# Patient Record
Sex: Female | Born: 1965 | Race: Black or African American | Hispanic: No | Marital: Single | Smoking: Current every day smoker
Health system: Southern US, Community
[De-identification: ages and names within clinical notes are randomized; demographics above are authoritative.]

## PROBLEM LIST (undated history)

## (undated) DIAGNOSIS — I10 Essential (primary) hypertension: Secondary | ICD-10-CM

## (undated) DIAGNOSIS — K219 Gastro-esophageal reflux disease without esophagitis: Secondary | ICD-10-CM

## (undated) DIAGNOSIS — E119 Type 2 diabetes mellitus without complications: Secondary | ICD-10-CM

## (undated) DIAGNOSIS — E079 Disorder of thyroid, unspecified: Secondary | ICD-10-CM

## (undated) DIAGNOSIS — I509 Heart failure, unspecified: Secondary | ICD-10-CM

## (undated) HISTORY — PX: CARDIAC CATHETERIZATION: SHX172

---

## 2016-03-17 ENCOUNTER — Encounter: Payer: Self-pay | Admitting: Emergency Medicine

## 2016-03-17 ENCOUNTER — Emergency Department
Admission: EM | Admit: 2016-03-17 | Discharge: 2016-03-17 | Disposition: A | Discharge status: Home / Self Care | Payer: Medicaid Other | Attending: Emergency Medicine | Admitting: Emergency Medicine

## 2016-03-17 DIAGNOSIS — Z79899 Other long term (current) drug therapy: Secondary | ICD-10-CM | POA: Diagnosis not present

## 2016-03-17 DIAGNOSIS — E119 Type 2 diabetes mellitus without complications: Secondary | ICD-10-CM | POA: Diagnosis not present

## 2016-03-17 DIAGNOSIS — F17210 Nicotine dependence, cigarettes, uncomplicated: Secondary | ICD-10-CM | POA: Diagnosis not present

## 2016-03-17 DIAGNOSIS — I509 Heart failure, unspecified: Secondary | ICD-10-CM | POA: Insufficient documentation

## 2016-03-17 DIAGNOSIS — M5412 Radiculopathy, cervical region: Secondary | ICD-10-CM

## 2016-03-17 DIAGNOSIS — I110 Hypertensive heart disease with heart failure: Secondary | ICD-10-CM | POA: Diagnosis not present

## 2016-03-17 DIAGNOSIS — M542 Cervicalgia: Secondary | ICD-10-CM | POA: Diagnosis present

## 2016-03-17 DIAGNOSIS — Z7982 Long term (current) use of aspirin: Secondary | ICD-10-CM | POA: Diagnosis not present

## 2016-03-17 DIAGNOSIS — Z7984 Long term (current) use of oral hypoglycemic drugs: Secondary | ICD-10-CM | POA: Insufficient documentation

## 2016-03-17 HISTORY — DX: Heart failure, unspecified: I50.9

## 2016-03-17 HISTORY — DX: Essential (primary) hypertension: I10

## 2016-03-17 HISTORY — DX: Gastro-esophageal reflux disease without esophagitis: K21.9

## 2016-03-17 HISTORY — DX: Type 2 diabetes mellitus without complications: E11.9

## 2016-03-17 HISTORY — DX: Disorder of thyroid, unspecified: E07.9

## 2016-03-17 LAB — GLUCOSE, CAPILLARY: GLUCOSE-CAPILLARY: 112 mg/dL — AB (ref 65–99)

## 2016-03-17 MED ORDER — MELOXICAM 15 MG PO TABS: 15.0000 mg | ORAL_TABLET | Freq: Every day | ORAL | 0 refills | Status: DC

## 2016-03-17 NOTE — ED Provider Notes (Signed)
[facility] Emergency Department Provider Note  ____________________________________________  Time seen: Approximately 2:42 PM  I have reviewed the triage vital signs and the nursing notes.   HISTORY  Chief Complaint Tingling    HPI Tracey Nolan is a 50 y.o. female , NAD, presents to the emergency department today history of pain and tingling about the right neck and right upper extremity. Patient states over the last 2 night she has been woken by a shooting pain along with tingling that starts about the right neck and can extend into the fingers on the right hand. States that the pain subsides when she gets up and stretches. States that she is not currently having back pain or tingling sensation. Denies any headaches, visual changes, chest pain, shortness of breath, wheezing, abdominal pain, nausea or vomiting. Has had no focal numbness or weakness. Denies any changes in speech or gait. Has not taken anything over-the-counter for her symptoms. Denies any falls, injuries or traumas. Has not noted any rashes, swelling, redness or open wounds.   Past Medical History:  Diagnosis Date  . CHF (congestive heart failure) (HCC)   . Diabetes mellitus without complication (HCC)   . GERD (gastroesophageal reflux disease)   . Hypertension   . Thyroid disease     There are no active problems to display for this patient.   Past Surgical History:  Procedure Laterality Date  . CARDIAC CATHETERIZATION    . CESAREAN SECTION      Prior to Admission medications   Medication Sig Start Date End Date Taking? Authorizing Provider  albuterol (PROVENTIL HFA;VENTOLIN HFA) 108 (90 Base) MCG/ACT inhaler Inhale 2 puffs into the lungs every 4 (four) hours as needed for wheezing or shortness of breath.   Yes Historical Provider, MD  aspirin EC 81 MG tablet Take 81 mg by mouth daily.   Yes Historical Provider, MD  atorvastatin (LIPITOR) 80 MG tablet Take 80 mg by mouth daily.   Yes  Historical Provider, MD  capsicum (ZOSTRIX) 0.075 % topical cream Apply 1 application topically 2 (two) times daily.   Yes Historical Provider, MD  carvedilol (COREG) 6.25 MG tablet Take 6.25 mg by mouth 2 (two) times daily with a meal.   Yes Historical Provider, MD  fluticasone (FLONASE) 50 MCG/ACT nasal spray Place 2 sprays into both nostrils daily.   Yes Historical Provider, MD  furosemide (LASIX) 40 MG tablet Take 40 mg by mouth every morning.   Yes Historical Provider, MD  gabapentin (NEURONTIN) 100 MG capsule Take 100 mg by mouth 3 (three) times daily.   Yes Historical Provider, MD  glipiZIDE (GLUCOTROL XL) 10 MG 24 hr tablet Take 10 mg by mouth 2 (two) times daily.   Yes Historical Provider, MD  levothyroxine (SYNTHROID, LEVOTHROID) 88 MCG tablet Take 88 mcg by mouth daily before breakfast.   Yes Historical Provider, MD  lisinopril (PRINIVIL,ZESTRIL) 20 MG tablet Take 20 mg by mouth daily.   Yes Historical Provider, MD  loratadine (CLARITIN) 10 MG tablet Take 10 mg by mouth daily.   Yes Historical Provider, MD  metFORMIN (GLUCOPHAGE) 1000 MG tablet Take 1,000 mg by mouth 2 (two) times daily with a meal.   Yes Historical Provider, MD  naproxen (NAPROSYN) 500 MG tablet Take 500 mg by mouth 2 (two) times daily with a meal.   Yes Historical Provider, MD  pantoprazole (PROTONIX) 40 MG tablet Take 40 mg by mouth daily.   Yes Historical Provider, MD  polyethylene glycol (MIRALAX / GLYCOLAX) packet  Take 17 g by mouth daily.   Yes Historical Provider, MD  potassium chloride (K-DUR,KLOR-CON) 10 MEQ tablet Take 10 mEq by mouth daily.   Yes Historical Provider, MD  meloxicam (MOBIC) 15 MG tablet Take 1 tablet (15 mg total) by mouth daily. 03/17/16   Denia Mcvicar L Rickie Gange, PA-C    Allergies Patient has no known allergies.  No family history on file.  Social History Social History  Substance Use Topics  . Smoking status: Current Every Day Smoker    Packs/day: 0.50    Types: Cigarettes  . Smokeless  tobacco: Never Used  . Alcohol use Yes     Comment: occasionally     Review of Systems  Constitutional: No fever/chills Eyes: No visual changes.  Cardiovascular: No chest pain. Respiratory: No shortness of breath.  Gastrointestinal: No abdominal pain.  No nausea, vomiting.   Musculoskeletal: Positive right sided neck pain radiating to distal right upper extremity. Negative for back pain.  Skin: Negative for rash, redness, skin sores. Neurological: Positive tingling right upper extremity. Negative for headaches, focal weakness or numbness. No dizziness. No changes in speech or gait. 10-point ROS otherwise negative.  ____________________________________________   PHYSICAL EXAM:  VITAL SIGNS: ED Triage Vitals  Enc Vitals Group     BP 03/17/16 1411 (!) 175/80     Pulse Rate 03/17/16 1411 85     Resp 03/17/16 1411 18     Temp 03/17/16 1411 97.5 F (36.4 C)     Temp Source 03/17/16 1411 Oral     SpO2 03/17/16 1411 100 %     Weight 03/17/16 1412 (!) 305 lb (138.3 kg)     Height 03/17/16 1412 5\' 4"  (1.626 m)     Head Circumference --      Peak Flow --      Pain Score --      Pain Loc --      Pain Edu? --      Excl. in GC? --      Constitutional: Alert and oriented. Well appearing and in no acute distress. Eyes: Conjunctivae are normal Without icterus or injection. PERRLA. EOMI without pain.  Head: Atraumatic. Neck: No cervical spine tenderness to palpation. Supple with full range of motion. No meningismus. No trapezial muscle spasms. Hematological/Lymphatic/Immunilogical: No cervical lymphadenopathy. Cardiovascular: Normal rate, regular rhythm. Normal S1 and S2. No murmurs, rubs, gallops. Good peripheral circulation. Respiratory: Normal respiratory effort without tachypnea or retractions. Lungs CTAB with breath sounds noted in all lung fields. No wheeze, rhonchi, rales. Musculoskeletal: No lower extremity tenderness nor edema.  No joint effusions. Full range of motion of  bilateral upper extremities without pain or difficulty. Neurologic:  Normal speech and language. Normal gait. No gross focal neurologic deficits are appreciated. Cranial nerves III through XII grossly intact.  Skin:  Skin is warm, dry and intact. No rash, redness, swelling, skin sores, open wounds noted. Psychiatric: Mood and affect are normal. Speech and behavior are normal. Patient exhibits appropriate insight and judgement.   ____________________________________________   LABS (all labs ordered are listed, but only abnormal results are displayed)  Labs Reviewed  GLUCOSE, CAPILLARY - Abnormal; Notable for the following:       Result Value   Glucose-Capillary 112 (*)    All other components within normal limits   ____________________________________________  EKG  None ____________________________________________  RADIOLOGY  None ____________________________________________    PROCEDURES  Procedure(s) performed: None   Procedures   Medications - No data to display   ____________________________________________  INITIAL IMPRESSION / ASSESSMENT AND PLAN / ED COURSE  Pertinent labs & imaging results that were available during my care of the patient were reviewed by me and considered in my medical decision making (see chart for details).  Clinical Course     Patient's diagnosis is consistent with Cervical radiculopathy. Patient will be discharged home with prescriptions for meloxicam to take as directed. Patient is to follow up [facility] Dr. Joice [facility]gi [facility] if symptoms persist past this treatment course. Patient is given ED precautions to return to the ED for any worsening or new symptoms.   ____________________________________________  FINAL CLINICAL IMPRESSION(S) / ED DIAGNOSES  Final diagnoses:  Cervical radiculopathy      NEW MEDICATIONS STARTED DURING THIS VISIT:  Discharge Medication List as of 03/17/2016  3:00 PM     START taking these medications   Details  meloxicam (MOBIC) 15 MG tablet Take 1 tablet (15 mg total) by mouth daily., Starting Sat 03/17/2016, Print             Hope PigeonJami L Nalla Purdy, PA-C 03/17/16 1613    Nita Sicklearolina Veronese, MD 03/17/16 2031

## 2016-03-17 NOTE — ED Triage Notes (Signed)
Patient presents to the ED with right arm numbness/tingling x 2 days from when she wakes up in the morning.  Patient is in no obvious distress at this time.  Patient denies chest pain.  Patient reports history of diabetes and states she hasn't been able to check her blood sugar the past few days.

## 2016-04-16 ENCOUNTER — Emergency Department
Admission: EM | Admit: 2016-04-16 | Discharge: 2016-04-16 | Disposition: A | Discharge status: Home / Self Care | Payer: Medicaid Other | Attending: Emergency Medicine | Admitting: Emergency Medicine

## 2016-04-16 ENCOUNTER — Emergency Department: Payer: Medicaid Other

## 2016-04-16 ENCOUNTER — Encounter: Payer: Self-pay | Admitting: Emergency Medicine

## 2016-04-16 DIAGNOSIS — Z760 Encounter for issue of repeat prescription: Secondary | ICD-10-CM | POA: Diagnosis not present

## 2016-04-16 DIAGNOSIS — F17210 Nicotine dependence, cigarettes, uncomplicated: Secondary | ICD-10-CM | POA: Diagnosis not present

## 2016-04-16 DIAGNOSIS — E119 Type 2 diabetes mellitus without complications: Secondary | ICD-10-CM

## 2016-04-16 DIAGNOSIS — I509 Heart failure, unspecified: Secondary | ICD-10-CM | POA: Diagnosis not present

## 2016-04-16 DIAGNOSIS — Z7984 Long term (current) use of oral hypoglycemic drugs: Secondary | ICD-10-CM | POA: Diagnosis not present

## 2016-04-16 DIAGNOSIS — I110 Hypertensive heart disease with heart failure: Secondary | ICD-10-CM | POA: Diagnosis not present

## 2016-04-16 DIAGNOSIS — J189 Pneumonia, unspecified organism: Secondary | ICD-10-CM | POA: Insufficient documentation

## 2016-04-16 DIAGNOSIS — Z79899 Other long term (current) drug therapy: Secondary | ICD-10-CM | POA: Diagnosis not present

## 2016-04-16 DIAGNOSIS — R05 Cough: Secondary | ICD-10-CM | POA: Diagnosis present

## 2016-04-16 LAB — INFLUENZA PANEL BY PCR (TYPE A & B)
Influenza A By PCR: NEGATIVE
Influenza B By PCR: NEGATIVE

## 2016-04-16 LAB — POCT RAPID STREP A: STREPTOCOCCUS, GROUP A SCREEN (DIRECT): NEGATIVE

## 2016-04-16 MED ORDER — GABAPENTIN 100 MG PO CAPS: 100.0000 mg | ORAL_CAPSULE | Freq: Three times a day (TID) | ORAL | 1 refills | Status: AC

## 2016-04-16 MED ORDER — METFORMIN HCL 1000 MG PO TABS: 1000.0000 mg | ORAL_TABLET | Freq: Two times a day (BID) | ORAL | 1 refills | Status: AC

## 2016-04-16 MED ORDER — PANTOPRAZOLE SODIUM 40 MG PO TBEC: 40.0000 mg | DELAYED_RELEASE_TABLET | Freq: Every day | ORAL | 1 refills | Status: AC

## 2016-04-16 MED ORDER — AZITHROMYCIN 250 MG PO TABS: ORAL_TABLET | ORAL | 0 refills | Status: DC

## 2016-04-16 MED ORDER — GLIPIZIDE ER 10 MG PO TB24: 10.0000 mg | ORAL_TABLET | Freq: Two times a day (BID) | ORAL | 1 refills | Status: AC

## 2016-04-16 MED ORDER — PREDNISONE 50 MG PO TABS: 50.0000 mg | ORAL_TABLET | Freq: Every day | ORAL | 0 refills | Status: DC

## 2016-04-16 MED ORDER — CARVEDILOL 6.25 MG PO TABS: 6.2500 mg | ORAL_TABLET | Freq: Two times a day (BID) | ORAL | 1 refills | Status: AC

## 2016-04-16 MED ORDER — LISINOPRIL 20 MG PO TABS: 20.0000 mg | ORAL_TABLET | Freq: Every day | ORAL | 1 refills | Status: AC

## 2016-04-16 MED ORDER — FUROSEMIDE 40 MG PO TABS: 40.0000 mg | ORAL_TABLET | Freq: Every morning | ORAL | 1 refills | Status: AC

## 2016-04-16 MED ORDER — PSEUDOEPH-BROMPHEN-DM 30-2-10 MG/5ML PO SYRP: 10.0000 mL | ORAL_SOLUTION | Freq: Four times a day (QID) | ORAL | 0 refills | Status: DC | PRN

## 2016-04-16 MED ORDER — CEFTRIAXONE SODIUM 1 G IJ SOLR
1.0000 g | Freq: Once | INTRAMUSCULAR | Status: AC
  Administered 2016-04-16: 1 g via INTRAMUSCULAR
  Filled 2016-04-16: qty 10

## 2016-04-16 MED ORDER — ATORVASTATIN CALCIUM 80 MG PO TABS: 80.0000 mg | ORAL_TABLET | Freq: Every day | ORAL | 1 refills | Status: AC

## 2016-04-16 MED ORDER — LEVOTHYROXINE SODIUM 88 MCG PO TABS: 88.0000 ug | ORAL_TABLET | Freq: Every day | ORAL | 1 refills | Status: AC

## 2016-04-16 MED ORDER — POTASSIUM CHLORIDE CRYS ER 10 MEQ PO TBCR: 10.0000 meq | EXTENDED_RELEASE_TABLET | Freq: Every day | ORAL | 1 refills | Status: AC

## 2016-04-16 MED ORDER — ALBUTEROL SULFATE HFA 108 (90 BASE) MCG/ACT IN AERS: 2.0000 | INHALATION_SPRAY | RESPIRATORY_TRACT | 1 refills | Status: DC | PRN

## 2016-04-16 NOTE — ED Provider Notes (Signed)
[facility] Emergency Department Provider Note  ____________________________________________  Time seen: Approximately 7:51 PM  I have reviewed the triage vital signs and the nursing notes.   HISTORY  Chief Complaint Cough    HPI Maryanna ShapeDonna Pienta is a 50 y.o. female who presents emergency department complaining of a 4 week history of cough, fatigue, malaise. Patient states that symptoms began insidiously and felt like "a cold" symptoms have just lingered. Patient reports that cough is productive, she has mild nasal congestion and sore throat. She denies any fevers or chills, difficulty breathing. Patient does have a history of CHF but denies any frothy sputum, lower extremity or pedal edema associated with this.  Patient has a significant history of CHF, diabetes, hypertension, hypothyroidism. Patient reports that she is out of her medications for these as she has just recently moved to the area. She is in the process of establishing primary care. Patient is currently out of all of her daily medications. She states that primary care will not fill these medications until she is seen but she is awaiting her appointment. Patient is requesting refill daily medications.   Past Medical History:  Diagnosis Date  . CHF (congestive heart failure) (HCC)   . Diabetes mellitus without complication (HCC)   . GERD (gastroesophageal reflux disease)   . Hypertension   . Thyroid disease     There are no active problems to display for this patient.   Past Surgical History:  Procedure Laterality Date  . CARDIAC CATHETERIZATION    . CESAREAN SECTION      Prior to Admission medications   Medication Sig Start Date End Date Taking? Authorizing Provider  albuterol (PROVENTIL HFA;VENTOLIN HFA) 108 (90 Base) MCG/ACT inhaler Inhale 2 puffs into the lungs every 4 (four) hours as needed for wheezing or shortness of breath. 04/16/16   Delorise RoyalsJonathan D Kyarah Enamorado, PA-C  aspirin EC 81 MG  tablet Take 81 mg by mouth daily.    Historical Provider, MD  atorvastatin (LIPITOR) 80 MG tablet Take 1 tablet (80 mg total) by mouth daily. 04/16/16   Delorise RoyalsJonathan D Zayana Salvador, PA-C  azithromycin (ZITHROMAX Z-PAK) 250 MG tablet Take 2 tablets (500 mg) on  Day 1,  followed by 1 tablet (250 mg) once daily on Days 2 through 5. 04/16/16   Christiane HaJonathan D Mora Pedraza, PA-C  brompheniramine-pseudoephedrine-DM 30-2-10 MG/5ML syrup Take 10 mLs by mouth 4 (four) times daily as needed. 04/16/16   Delorise RoyalsJonathan D Jennife Zaucha, PA-C  capsicum (ZOSTRIX) 0.075 % topical cream Apply 1 application topically 2 (two) times daily.    Historical Provider, MD  carvedilol (COREG) 6.25 MG tablet Take 1 tablet (6.25 mg total) by mouth 2 (two) times daily with a meal. 04/16/16   Delorise RoyalsJonathan D Ameshia Pewitt, PA-C  fluticasone (FLONASE) 50 MCG/ACT nasal spray Place 2 sprays into both nostrils daily.    Historical Provider, MD  furosemide (LASIX) 40 MG tablet Take 1 tablet (40 mg total) by mouth every morning. 04/16/16   Delorise RoyalsJonathan D Arcelia Pals, PA-C  gabapentin (NEURONTIN) 100 MG capsule Take 1 capsule (100 mg total) by mouth 3 (three) times daily. 04/16/16   Delorise RoyalsJonathan D Abdalla Naramore, PA-C  glipiZIDE (GLUCOTROL XL) 10 MG 24 hr tablet Take 1 tablet (10 mg total) by mouth 2 (two) times daily. 04/16/16   Delorise RoyalsJonathan D Jelan Batterton, PA-C  levothyroxine (SYNTHROID, LEVOTHROID) 88 MCG tablet Take 1 tablet (88 mcg total) by mouth daily before breakfast. 04/16/16   Delorise RoyalsJonathan D Jacaria Colburn, PA-C  lisinopril (PRINIVIL,ZESTRIL) 20 MG tablet Take 1 tablet (  20 mg total) by mouth daily. 04/16/16   Delorise RoyalsJonathan D Desteny Freeman, PA-C  loratadine (CLARITIN) 10 MG tablet Take 10 mg by mouth daily.    Historical Provider, MD  meloxicam (MOBIC) 15 MG tablet Take 1 tablet (15 mg total) by mouth daily. 03/17/16   Jami L Hagler, PA-C  metFORMIN (GLUCOPHAGE) 1000 MG tablet Take 1 tablet (1,000 mg total) by mouth 2 (two) times daily with a meal. 04/16/16   Delorise RoyalsJonathan D Gaila Engebretsen, PA-C  naproxen  (NAPROSYN) 500 MG tablet Take 500 mg by mouth 2 (two) times daily with a meal.    Historical Provider, MD  pantoprazole (PROTONIX) 40 MG tablet Take 1 tablet (40 mg total) by mouth daily. 04/16/16   Delorise RoyalsJonathan D Zyaira Vejar, PA-C  polyethylene glycol (MIRALAX / GLYCOLAX) packet Take 17 g by mouth daily.    Historical Provider, MD  potassium chloride (K-DUR,KLOR-CON) 10 MEQ tablet Take 1 tablet (10 mEq total) by mouth daily. 04/16/16   Delorise RoyalsJonathan D Trek Kimball, PA-C  predniSONE (DELTASONE) 50 MG tablet Take 1 tablet (50 mg total) by mouth daily with breakfast. 04/16/16   Delorise RoyalsJonathan D Ryanne Morand, PA-C    Allergies Patient has no known allergies.  No family history on file.  Social History Social History  Substance Use Topics  . Smoking status: Current Every Day Smoker    Packs/day: 0.50    Types: Cigarettes  . Smokeless tobacco: Never Used  . Alcohol use Yes     Comment: occasionally     Review of Systems  Constitutional: No fever/chills Eyes: No visual changes. No discharge ENT: Positive for nasal congestion and sore throat Cardiovascular: no chest pain. She denies any frothy sputum. She denies any lower extremity or pedal edema. Respiratory: Positive for productive. No SOB. Gastrointestinal: No abdominal pain.  No nausea, no vomiting. Musculoskeletal: Negative for musculoskeletal pain. Skin: Negative for rash, abrasions, lacerations, ecchymosis. Neurological: Negative for headaches, focal weakness or numbness. 10-point ROS otherwise negative.  ____________________________________________   PHYSICAL EXAM:  VITAL SIGNS: ED Triage Vitals  Enc Vitals Group     BP 04/16/16 1803 (!) 193/92     Pulse Rate 04/16/16 1803 91     Resp 04/16/16 1803 20     Temp 04/16/16 1803 98.3 F (36.8 C)     Temp Source 04/16/16 1803 Oral     SpO2 04/16/16 1803 99 %     Weight 04/16/16 1805 (!) 310 lb (140.6 kg)     Height 04/16/16 1805 5\' 4"  (1.626 m)     Head Circumference --      Peak Flow --       Pain Score 04/16/16 1809 8     Pain Loc --      Pain Edu? --      Excl. in GC? --      Constitutional: Alert and oriented. Well appearing and in no acute distress. Eyes: Conjunctivae are normal. PERRL. EOMI. Head: Atraumatic. ENT:      Ears:       Nose: Mild congestion/rhinnorhea.      Mouth/Throat: Mucous membranes are moist. Oropharynx is not erythematous but nonedematous. Uvula is midline. Neck: No stridor.   Hematological/Lymphatic/Immunilogical: No cervical lymphadenopathy. Cardiovascular: Normal rate, regular rhythm. Normal S1 and S2.  Good peripheral circulation. No significant pedal or lower extremity edema. Respiratory: Normal respiratory effort without tachypnea or retractions. Lungs with scattered wheezing bilaterally. No rales or rhonchi.Peri [place]. Good air entry to the bases with no decreased or absent breath sounds. Musculoskeletal: Full range of  motion to all extremities. No gross deformities appreciated. Neurologic:  Normal speech and language. No gross focal neurologic deficits are appreciated.  Skin:  Skin is warm, dry and intact. No rash noted. Psychiatric: Mood and affect are normal. Speech and behavior are normal. Patient exhibits appropriate insight and judgement.   ____________________________________________   LABS (all labs ordered are listed, but only abnormal results are displayed)  Labs Reviewed  INFLUENZA PANEL BY PCR (TYPE A & B, H1N1)  POCT RAPID STREP A   ____________________________________________  EKG   ____________________________________________  RADIOLOGY Festus Barren Iline Buchinger, personally viewed and evaluated these images (plain radiographs) as part of my medical decision making, as well as reviewing the written report by the radiologist.  Dg Chest 2 View  Result Date: 04/16/2016 CLINICAL DATA:  Cough and congestion EXAM: CHEST  2 VIEW COMPARISON:  None. FINDINGS: The heart size and mediastinal contours are within normal limits. Both  lungs are clear. The visualized skeletal structures are unremarkable. IMPRESSION: No active cardiopulmonary disease. Electronically Signed   By: Alcide Clever M.D.   On: 04/16/2016 18:32    ____________________________________________    PROCEDURES  Procedure(s) performed:    Procedures    Medications  cefTRIAXone (ROCEPHIN) injection 1 g (1 g Intramuscular Given 04/16/16 2009)     ____________________________________________   INITIAL IMPRESSION / ASSESSMENT AND PLAN / ED COURSE  Pertinent labs & imaging results that were available during my care of the patient were reviewed by me and considered in my medical decision making (see chart for details).  Review of the [place] CSRS was performed in accordance of the [facility] prior to dispensing any controlled drugs.  Clinical Course     Patient's diagnosis is consistent with Community-acquired pneumonia. Patient has had symptoms ongoing 4 weeks. Over-the-counter medications have been unsuccessful in relieving symptoms. Due to the patient's course and history, it is felt that patient's symptoms are most consistent with community-acquired pneumonia. X-ray reveals no acute consolidation. Patient is given shot of Rocephin in the emergency department will be discharged home with antibiotics, short course of steroids, albuterol inhaler. Patient also has a significant medical history and is requesting medication refill emergency Department as she is noted to area and primary care will not refill her medications until her appointment. At this time, patient is given refill of her CHF, hypertension, hypothyroidism medications. Patient is advised that ER is not the place to refill medications but we will refill as patient is waiting for her primary care appointment at which, they may refill her medications on an ongoing basis.  Patient is given ED precautions to return to the ED for any worsening or new  symptoms.     ____________________________________________  FINAL CLINICAL IMPRESSION(S) / ED DIAGNOSES  Final diagnoses:  Community acquired pneumonia, unspecified laterality  Medication refill  Chronic congestive heart failure, unspecified congestive heart failure type (HCC)  Type 2 diabetes mellitus without complication, without long-term current use of insulin (HCC)      NEW MEDICATIONS STARTED DURING THIS VISIT:  New Prescriptions   AZITHROMYCIN (ZITHROMAX Z-PAK) 250 MG TABLET    Take 2 tablets (500 mg) on  Day 1,  followed by 1 tablet (250 mg) once daily on Days 2 through 5.   BROMPHENIRAMINE-PSEUDOEPHEDRINE-DM 30-2-10 MG/5ML SYRUP    Take 10 mLs by mouth 4 (four) times daily as needed.   PREDNISONE (DELTASONE) 50 MG TABLET    Take 1 tablet (50 mg total) by mouth daily with breakfast.  This chart was dictated using voice recognition software/Dragon. Despite best efforts to proofread, errors can occur which can change the meaning. Any change was purely unintentional.    Racheal Patches, PA-C 04/16/16 2016    Rockne Menghini, MD 04/16/16 2208

## 2016-04-16 NOTE — ED Triage Notes (Signed)
Patient presents to the ED with body aches, cough, congestion, nasal drainage and sore throat x 3 weeks.  Patient states, "I've tried all the over the counter stuff and it's not working."  Patient is not in any obvious distress at this time.  Ambulatory to triage without difficulty.

## 2016-04-16 NOTE — ED Notes (Signed)
See triage note. Developed sore throat nasal congestion and cough about 3 weeks ago    conts to have cough  Afebrile on arrival

## 2016-04-16 NOTE — ED Notes (Signed)
Pt. Verbalizes understanding of d/c instructions, prescriptions, and follow-up. VS stable and pt denies pain.  Pt. In NAD at time of d/c and denies further concerns regarding this visit. Pt. Stable at the time of departure from the unit, departing unit by the safest and most appropriate manner per that pt condition and limitations. Pt advised to return to the ED at any time for emergent concerns, or for new/worsening symptoms.   

## 2016-05-21 ENCOUNTER — Encounter: Payer: Self-pay | Admitting: Pharmacist

## 2016-05-21 ENCOUNTER — Ambulatory Visit: Payer: Medicaid Other | Admitting: Pharmacy Technician

## 2016-05-21 ENCOUNTER — Ambulatory Visit: Payer: Medicaid Other | Admitting: Pharmacist

## 2016-05-21 ENCOUNTER — Encounter (INDEPENDENT_AMBULATORY_CARE_PROVIDER_SITE_OTHER): Payer: Self-pay

## 2016-05-21 VITALS — BP 140/80 | Wt 278.0 lb

## 2016-05-21 DIAGNOSIS — Z79899 Other long term (current) drug therapy: Secondary | ICD-10-CM

## 2016-05-21 NOTE — Progress Notes (Signed)
Medication Management Clinic Visit Note  Patient: Tracey Nolan MRN: [id number] Date of Birth: 1965/11/02 PCP: No PCP Per Patient   Tracey Nolan 51 y.o. female presents for an initial MTM visit today.  There were no vitals taken for this visit.  Patient Information   Past Medical History:  Diagnosis Date  . CHF (congestive heart failure) (HCC)   . Diabetes mellitus without complication (HCC)   . GERD (gastroesophageal reflux disease)   . Hypertension   . Thyroid disease       Past Surgical History:  Procedure Laterality Date  . CARDIAC CATHETERIZATION    . CESAREAN SECTION      No family history on file.  New Diagnoses (since last visit):   Family Support: Comments:Fair   Lifestyle Diet: Breakfast: Don't eat breakfast; coffee with cream and sugar  Lunch: balanced meal (broccoli, peaches, chicken nuggets)  Dinner: Don't eat dinner usually; frozen meal  Drinks: water or another cup of coffee          History  Alcohol Use  . Yes    Comment: occasionally      History  Smoking Status  . Current Every Day Smoker  . Packs/day: 0.50  . Types: Cigarettes  Smokeless Tobacco  . Never Used      Health Maintenance  Topic Date Due  . HEMOGLOBIN A1C  1966-04-21  . PNEUMOCOCCAL POLYSACCHARIDE VACCINE (1) 07/12/1967  . FOOT EXAM  07/12/1975  . OPHTHALMOLOGY EXAM  07/12/1975  . HIV Screening  07/11/1980  . TETANUS/TDAP  07/11/1984  . PAP SMEAR  07/12/1986  . MAMMOGRAM  07/12/2015  . COLONOSCOPY  07/12/2015  . INFLUENZA VACCINE  12/06/2015     Assessment and Plan: Tracey Nolan is a 51yo AAF who presents to the medication management clinic for an initial MTM. She is a new patient to this area and is trying to establish care. She was last seen by her doctor in June 2017 [place] where she used to live. Pt states she is compliant with medications.   Diabetes: uncontrolled. Pt states her last A1c in June was greater than 7%. Pt will need a follow-up A1c.  She takes metformin and glipizide. She has not been checking her blood sugars lately due to being out of lancets. During this visit nutrition was addressed as patient eats one meal a day. She was informed on how to read a nutrition label, however she would benefit from further conversations around diabetic diets. She does not complain of any hypo- or hyperglycemic episodes at this time. She does take gabapentin for pain in her feet and knees. Her pain score stays around an 8 in which she currently tolerates this pain.   Glaucoma: patient has a past medication history. She was taking an eye drop at night, but unsure what the name was at the current time. She has not used these eye drops since she moved from [place]. back in September. Patient will need to be assessed for need of eye drops.     High Cholesterol: patient last has a lipid panel in June 2017. She does not recall her lipid panel at this time. She is currently taking and tolerating atorvastatin 80mg .   CHF: patient been taking aspirin, carvedilol, furosemide, and lisinopril. Patient has been complaining of feeling more SOB lately. She has been having to use 2 pillows at night compared to normally one pillow to prop herself up. She was counseled on the importance of checking daily weights. She reports taking  furosemide at night due to her job. 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 The furosemide keeps patient up at night to go to the bathroom. Informed patient to take in the afternoon. She is on albuterol for when she gets SOB and her heart doesn't strain.   GERD: Controlled at this time with pantoprazole.   HTN: controlled at this time with a BP 140/2090mmHg. She currently takes lisinopril for her BP.   Hypothyroidism: currently taking levothyroxine. Her last TSH was checked in June 2017. Other than her hair thinning and breaking she does not complain of any other  symptoms.  Constipation: uncontrolled. Pt reports not having a BM for 7-8 days at a time sometimes close to 11 days. She says this has been an ongoing problem. She drinks only 1 bottle of water a day due to not having time during the day with work to go to the bathroom. She was taking miralax, however has since stopped due to the medication being expensive per patient. Pt states if it gets really bad that she uses an enema which is quick relief. Informed the pt of the importance of going on a regular basis.    Delsa BernKelly m Harshita Bernales, PharmD 10:43 PM 05/22/2016

## 2016-05-21 NOTE — Progress Notes (Signed)
Met with patient completed financial assistance application for [place] due to recent hospital visit.  Patient agreed to be responsible for gathering financial information and forwarding to appropriate department in [facility].    Completed [facility]ication Management Clinic application and contract.  Patient agreed to all terms of the [facility]ication Management Clinic contract.  Patient to provide pay stubs, utility bill, checking account statement, 2016 taxes and 2017 taxes when available.  Provided patient with community resource material based on her particular needs.    Referred patient to [facility] and [employer].  [facility]

## 2016-07-05 ENCOUNTER — Telehealth: Payer: Self-pay | Admitting: Pharmacy Technician

## 2016-07-05 NOTE — Telephone Encounter (Signed)
Patient failed to provide proof of residency, pay stubs for 2018, last 30 days of checking account statement and 2017 tax return.  [facility] will be unable to provide additional medication assistance until eligibility is determined.  Sherilyn DacostaBetty J. Kluttz Care Manager Medication Management Clinic

## 2017-10-11 ENCOUNTER — Emergency Department
Admission: EM | Admit: 2017-10-11 | Discharge: 2017-10-11 | Disposition: A | Discharge status: Home / Self Care | Payer: Self-pay | Attending: Emergency Medicine | Admitting: Emergency Medicine

## 2017-10-11 ENCOUNTER — Emergency Department: Payer: Self-pay

## 2017-10-11 DIAGNOSIS — I110 Hypertensive heart disease with heart failure: Secondary | ICD-10-CM | POA: Insufficient documentation

## 2017-10-11 DIAGNOSIS — I509 Heart failure, unspecified: Secondary | ICD-10-CM | POA: Insufficient documentation

## 2017-10-11 DIAGNOSIS — Z79899 Other long term (current) drug therapy: Secondary | ICD-10-CM | POA: Insufficient documentation

## 2017-10-11 DIAGNOSIS — F17210 Nicotine dependence, cigarettes, uncomplicated: Secondary | ICD-10-CM | POA: Insufficient documentation

## 2017-10-11 DIAGNOSIS — M79604 Pain in right leg: Secondary | ICD-10-CM | POA: Insufficient documentation

## 2017-10-11 DIAGNOSIS — E119 Type 2 diabetes mellitus without complications: Secondary | ICD-10-CM | POA: Insufficient documentation

## 2017-10-11 DIAGNOSIS — Z7982 Long term (current) use of aspirin: Secondary | ICD-10-CM | POA: Insufficient documentation

## 2017-10-11 LAB — GLUCOSE, CAPILLARY: Glucose-Capillary: 90 mg/dL (ref 65–99)

## 2017-10-11 MED ORDER — TRAMADOL HCL 50 MG PO TABS: 50.0000 mg | ORAL_TABLET | Freq: Two times a day (BID) | ORAL | 0 refills | Status: DC | PRN

## 2017-10-11 MED ORDER — CYCLOBENZAPRINE HCL 10 MG PO TABS
10.0000 mg | ORAL_TABLET | Freq: Once | ORAL | Status: AC
  Administered 2017-10-11: 10 mg via ORAL
  Filled 2017-10-11: qty 1

## 2017-10-11 MED ORDER — TRAMADOL HCL 50 MG PO TABS
50.0000 mg | ORAL_TABLET | Freq: Once | ORAL | Status: AC
  Administered 2017-10-11: 50 mg via ORAL
  Filled 2017-10-11: qty 1

## 2017-10-11 MED ORDER — CYCLOBENZAPRINE HCL 10 MG PO TABS: 10.0000 mg | ORAL_TABLET | Freq: Three times a day (TID) | ORAL | 0 refills | Status: DC | PRN

## 2017-10-11 NOTE — ED Triage Notes (Signed)
Right leg pain X 1 month, described as shooting and "sawing". Worsening pain overnight. No injury. Upper leg pain only.

## 2017-10-11 NOTE — Discharge Instructions (Addendum)
Call orthopedic department and schedule appointment for definitive evaluation and treatment of your right leg pain.

## 2017-10-11 NOTE — ED Notes (Signed)
First Nurse Note: Pt to ED via POV c/o right leg pain. Pt is in NAD at this time.

## 2017-10-11 NOTE — ED Notes (Signed)
See triage note  Presents with pain to right groin/hip area  States pain started about 1 month ago w/o injury States Aleve usually takes care of the pain  But today the pain was worse

## 2017-10-11 NOTE — ED Provider Notes (Signed)
[facility] Emergency Department Provider Note   ____________________________________________   First MD Initiated Contact with Patient 10/11/17 1337     (approximate)  I have reviewed the triage vital signs and the nursing notes.   HISTORY  Chief Complaint Leg Pain    HPI Tracey Nolan is a 52 y.o. female patient presents with 1 month of right upper leg pain.  Patient described the pain as"shooting/sign".  Patient the pain worsen this morning.  Patient denies provocative incident for complaint.  Patient rates the pain as a 10/10.  No palliative measures for complaint.  Past Medical History:  Diagnosis Date  . CHF (congestive heart failure) (HCC)   . Diabetes mellitus without complication (HCC)   . GERD (gastroesophageal reflux disease)   . Hypertension   . Thyroid disease     There are no active problems to display for this patient.   Past Surgical History:  Procedure Laterality Date  . CARDIAC CATHETERIZATION    . CESAREAN SECTION      Prior to Admission medications   Medication Sig Start Date End Date Taking? Authorizing Provider  albuterol (PROVENTIL HFA;VENTOLIN HFA) 108 (90 Base) MCG/ACT inhaler Inhale 2 puffs into the lungs every 4 (four) hours as needed for wheezing or shortness of breath. 04/16/16   Cuthriell, Delorise Royals, PA-C  aspirin EC 81 MG tablet Take 81 mg by mouth daily.    [provider]  atorvastatin (LIPITOR) 80 MG tablet Take 1 tablet (80 mg total) by mouth daily. 04/16/16   Cuthriell, Delorise Royals, PA-C  azithromycin (ZITHROMAX Z-PAK) 250 MG tablet Take 2 tablets (500 mg) on  Day 1,  followed by 1 tablet (250 mg) once daily on Days 2 through 5. Patient not taking: Reported on 05/21/2016 04/16/16   Cuthriell, Delorise Royals, PA-C  brompheniramine-pseudoephedrine-DM 30-2-10 MG/5ML syrup Take 10 mLs by mouth 4 (four) times daily as needed. Patient not taking: Reported on 05/21/2016 04/16/16   Cuthriell, Delorise Royals, PA-C    capsicum (ZOSTRIX) 0.075 % topical cream Apply 1 application topically 2 (two) times daily.    [provider]  carvedilol (COREG) 6.25 MG tablet Take 1 tablet (6.25 mg total) by mouth 2 (two) times daily with a meal. 04/16/16   Cuthriell, Delorise Royals, PA-C  cyclobenzaprine (FLEXERIL) 10 MG tablet Take 1 tablet (10 mg total) by mouth 3 (three) times daily as needed. 10/11/17   Joni Reining, PA-C  fluticasone (FLONASE) 50 MCG/ACT nasal spray Place 2 sprays into both nostrils daily.    [provider]  furosemide (LASIX) 40 MG tablet Take 1 tablet (40 mg total) by mouth every morning. 04/16/16   Cuthriell, Delorise Royals, PA-C  gabapentin (NEURONTIN) 100 MG capsule Take 1 capsule (100 mg total) by mouth 3 (three) times daily. 04/16/16   Cuthriell, Delorise Royals, PA-C  glipiZIDE (GLUCOTROL XL) 10 MG 24 hr tablet Take 1 tablet (10 mg total) by mouth 2 (two) times daily. 04/16/16   Cuthriell, Delorise Royals, PA-C  levothyroxine (SYNTHROID, LEVOTHROID) 88 MCG tablet Take 1 tablet (88 mcg total) by mouth daily before breakfast. 04/16/16   Cuthriell, Delorise Royals, PA-C  lisinopril (PRINIVIL,ZESTRIL) 20 MG tablet Take 1 tablet (20 mg total) by mouth daily. 04/16/16   Cuthriell, Delorise Royals, PA-C  loratadine (CLARITIN) 10 MG tablet Take 10 mg by mouth daily.    [provider]  meloxicam (MOBIC) 15 MG tablet Take 1 tablet (15 mg total) by mouth daily. Patient not taking: Reported on 05/21/2016  03/17/16   Hagler, Jami L, PA-C  metFORMIN (GLUCOPHAGE) 1000 MG tablet Take 1 tablet (1,000 mg total) by mouth 2 (two) times daily with a meal. 04/16/16   Cuthriell, Delorise Royals, PA-C  naproxen (NAPROSYN) 500 MG tablet Take 500 mg by mouth 2 (two) times daily with a meal.    [provider]  pantoprazole (PROTONIX) 40 MG tablet Take 1 tablet (40 mg total) by mouth daily. 04/16/16   Cuthriell, Delorise Royals, PA-C  polyethylene glycol (MIRALAX / GLYCOLAX) packet Take 17 g by mouth daily.    [provider]  potassium chloride (K-DUR,KLOR-CON) 10 MEQ tablet Take 1 tablet (10 mEq total) by mouth daily. 04/16/16   Cuthriell, Delorise Royals, PA-C  predniSONE (DELTASONE) 50 MG tablet Take 1 tablet (50 mg total) by mouth daily with breakfast. Patient not taking: Reported on 05/21/2016 04/16/16   Cuthriell, Delorise Royals, PA-C  traMADol (ULTRAM) 50 MG tablet Take 1 tablet (50 mg total) by mouth every 12 (twelve) hours as needed. 10/11/17   Joni Reining, PA-C    Allergies Patient has no known allergies.  Family History  Problem Relation Age of Onset  . Hypertension Mother   . Diabetes Mother   . Heart attack Mother   . Pancreatic cancer Father   . Obesity Sister   . Heart attack Brother   . Stroke Brother   . Kidney disease Brother   . Asthma Daughter   . Breast cancer Maternal Grandmother   . Lung cancer Maternal Grandfather   . Cirrhosis Paternal Grandmother   . Alcohol abuse Paternal Grandmother     Social History Social History   Tobacco Use  . Smoking status: Current Every Day Smoker    Packs/day: 0.50    Types: Cigarettes  . Smokeless tobacco: Never Used  Substance Use Topics  . Alcohol use: Yes    Comment: occasionally  . Drug use: No    Review of Systems  Constitutional: No fever/chills.  Morbid obesity. Eyes: No visual changes. ENT: No sore throat. Cardiovascular: Denies chest pain. Respiratory: Mild shortness of breath.  History of CHF. Gastrointestinal: No abdominal pain.  No nausea, no vomiting.  No diarrhea.  No constipation.  History of GERD. Genitourinary: Negative for dysuria. Musculoskeletal: Positive for back pain and right hip pain. Skin: Negative for rash. Neurological: Negative for headaches, focal weakness or numbness. Endocrine:Diabetes, hyperlipidemia, hypertension, and hypothyroidism. ____________________________________________   PHYSICAL EXAM:  VITAL SIGNS: ED Triage Vitals [10/11/17 1337]  Enc Vitals Group     BP 140/76      Pulse Rate 81     Resp 18     Temp 97.9 F (36.6 C)     Temp Source Oral     SpO2 99 %     Weight 300 lb (136.1 kg)     Height 5\' 4"  (1.626 m)     Head Circumference      Peak Flow      Pain Score 10     Pain Loc      Pain Edu?      Excl. in GC?     Constitutional: Alert and oriented. Well appearing and in no acute distress. Cardiovascular: Normal rate, regular rhythm. Grossly normal heart sounds.  Good peripheral circulation. Respiratory: Normal respiratory effort.  No retractions. Lungs CTAB. Gastrointestinal: Soft and nontender. No distention. No abdominal bruits. No CVA tenderness. Genitourinary: Deferred Musculoskeletal: Right anterior thigh extremity t guarding with palpation. No edema.  No joint effusions. Neurologic:  Normal speech and language. No gross focal neurologic deficits are appreciated. No gait instability. Skin:  Skin is warm, dry and intact. No rash noted. Psychiatric: Mood and affect are normal. Speech and behavior are normal.  ____________________________________________   LABS (all labs ordered are listed, but only abnormal results are displayed)  Labs Reviewed  GLUCOSE, CAPILLARY  CBG MONITORING, ED   ____________________________________________  EKG   ____________________________________________  RADIOLOGY  ED MD interpretation:    Official radiology report(s): Dg Lumbar Spine 2-3 Views  Result Date: 10/11/2017 CLINICAL DATA:  Lumbago for approximately 1 month EXAM: LUMBAR SPINE - 2-3 VIEW COMPARISON:  None. FINDINGS: Frontal, lateral, and spot lumbosacral lateral images were obtained. There are 5 non-rib-bearing lumbar type vertebral bodies. There is no fracture or spondylolisthesis. Disc spaces appear unremarkable. No erosive change. There is aortic atherosclerosis. IMPRESSION: No fracture or spondylolisthesis. No appreciable arthropathy. There is aortic atherosclerosis. Aortic Atherosclerosis (ICD10-I70.0). Electronically Signed   By:  Bretta BangWilliam  Woodruff III M.D.   On: 10/11/2017 14:33   [place] Venous Img Lower Unilateral Right  Result Date: 10/11/2017 CLINICAL DATA:  52 year old female with right lower extremity leg pain for the past month EXAM: RIGHT LOWER EXTREMITY VENOUS DOPPLER ULTRASOUND TECHNIQUE: Gray-scale sonography with graded compression, as well as color Doppler and duplex ultrasound were performed to evaluate the lower extremity deep venous systems from the level of the common femoral vein and including the common femoral, femoral, profunda femoral, popliteal and calf veins including the posterior tibial, peroneal and gastrocnemius veins when visible. The superficial great saphenous vein was also interrogated. Spectral Doppler was utilized to evaluate flow at rest and with distal augmentation maneuvers in the common femoral, femoral and popliteal veins. COMPARISON:  None. FINDINGS: Contralateral Common Femoral Vein: Respiratory phasicity is normal and symmetric with the symptomatic side. No evidence of thrombus. Normal compressibility. Common Femoral Vein: No evidence of thrombus. Normal compressibility, respiratory phasicity and response to augmentation. Saphenofemoral Junction: No evidence of thrombus. Normal compressibility and flow on color Doppler imaging. Profunda Femoral Vein: No evidence of thrombus. Normal compressibility and flow on color Doppler imaging. Femoral Vein: No evidence of thrombus. Normal compressibility, respiratory phasicity and response to augmentation. Popliteal Vein: No evidence of thrombus. Normal compressibility, respiratory phasicity and response to augmentation. Calf Veins: No evidence of thrombus. Normal compressibility and flow on color Doppler imaging. Superficial Great Saphenous Vein: No evidence of thrombus. Normal compressibility. Venous Reflux:  None. Other Findings:  None. IMPRESSION: No evidence of deep venous thrombosis. Electronically Signed   By: Malachy MoanHeath  McCullough M.D.   On: 10/11/2017 15:03     Dg Hip Unilat W Or Wo Pelvis 2-3 Views Right  Result Date: 10/11/2017 CLINICAL DATA:  Pain for approximately 1 month EXAM: DG HIP (WITH OR WITHOUT PELVIS) 2-3V RIGHT COMPARISON:  None. FINDINGS: Frontal pelvis as well as frontal and lateral right hip images were obtained. There is no fracture or dislocation. There is slight symmetric narrowing of both hip joints. No erosive change. Sacroiliac joints appear symmetric and unremarkable bilaterally. IMPRESSION: Slight symmetric narrowing of both hip joints. No fracture or dislocation. Electronically Signed   By: Bretta BangWilliam  Woodruff III M.D.   On: 10/11/2017 14:32    ____________________________________________   PROCEDURES  Procedure(s) performed: None  Procedures  Critical Care performed: No  ____________________________________________   INITIAL IMPRESSION / ASSESSMENT AND PLAN / ED COURSE  As part of my medical decision making, I reviewed the following data within the electronic MEDICAL RECORD NUMBER    Patient presents  for chronic right leg pain.  Physical exam was unremarkable complaint.  Discussed negative findings of imaging of the lumbar spine and right hip.  Patient also had a negative findings of DVT via ultrasound.  Patient given discharge care instructions.  Patient advised to follow orthopedic for definitive evaluation and treatment.      ____________________________________________   FINAL CLINICAL IMPRESSION(S) / ED DIAGNOSES  Final diagnoses:  Right leg pain     ED Discharge Orders        Ordered    traMADol (ULTRAM) 50 MG tablet  Every 12 hours PRN     10/11/17 1522    cyclobenzaprine (FLEXERIL) 10 MG tablet  3 times daily PRN     10/11/17 1523       Note:  This document was prepared using Dragon voice recognition software and may include unintentional dictation errors.    Joni Reining, PA-C 10/11/17 1527    Schaevitz, Myra Rude, MD 10/11/17 1600

## 2020-03-16 ENCOUNTER — Ambulatory Visit: Payer: Self-pay | Attending: Oncology | Admitting: *Deleted

## 2020-03-16 ENCOUNTER — Other Ambulatory Visit: Payer: Self-pay

## 2020-03-16 ENCOUNTER — Encounter (INDEPENDENT_AMBULATORY_CARE_PROVIDER_SITE_OTHER): Payer: Self-pay

## 2020-03-16 ENCOUNTER — Ambulatory Visit
Admission: RE | Admit: 2020-03-16 | Discharge: 2020-03-16 | Disposition: A | Discharge status: Home / Self Care | Payer: Medicaid Other | Source: Ambulatory Visit | Attending: Oncology | Admitting: Oncology

## 2020-03-16 ENCOUNTER — Encounter: Payer: Self-pay | Admitting: *Deleted

## 2020-03-16 VITALS — BP 160/80 | HR 85 | Temp 98.1°F | Ht 65.0 in | Wt 277.4 lb

## 2020-03-16 DIAGNOSIS — Z0000 Encounter for general adult medical examination without abnormal findings: Secondary | ICD-10-CM

## 2020-03-16 NOTE — Patient Instructions (Signed)
Gave patient hand-out, Women Staying Healthy, Active and Well from [facility], with education on breast health, pap smears, heart and colon health. 

## 2020-03-16 NOTE — Progress Notes (Signed)
Subjective:     Patient ID: Tracey Nolan, female   DOB: Jun 30, 1965, 54 y.o.   MRN: [id number]  HPI   [facility] Medical History Record - 03/16/20 1100      Breast History   Screening cycle New    CBE Date 03/16/20    Initial Mammogram 03/16/20    Last Mammogram See comment   Last Mammogram was > 10 years ago at [facility], [place], [place]   Last Mammogram Date 03/16/09    Provider (Mammogram)  [facility], [place], [place]    Recent Breast Symptoms Skin changes   Black spot under right breast x 1 month - fungal infection under both breast     Breast Cancer History   Breast Cancer History No personal or family history      Previous History of Breast Problems   Breast Surgery or Biopsy None    Breast Implants N/A    BSE Done Never      Gynecological/Obstetrical History   Is there any chance that the client could be pregnant?  No    Age at menarche 57    Age at menopause 78    PAP smear history See comments   >10y   Age at first live birth 3    Breast fed children No    DES Exposure No    Cervical, Uterine or Ovarian cancer No    Family history of Cervial, Uterine or Ovarian cancer No    Hysterectomy No    Cervix removed No    Ovaries removed No    Laser/Cryosurgery No    Current method of birth control None    Current method of Estrogen/Hormone replacement None    Smoking history Yes             Review of Systems     Objective:   Physical Exam Chest:     Breasts:        Right: No swelling, bleeding, inverted nipple, mass, nipple discharge, skin change or tenderness.        Left: No swelling, bleeding, inverted nipple, mass, nipple discharge, skin change or tenderness.  Abdominal:     Palpations: There is no hepatomegaly or splenomegaly.  Genitourinary:    Exam position: Lithotomy position.     Labia:        Right: No rash, tenderness, lesion or injury.        Left: No rash, tenderness, lesion or injury.      Vagina: No signs of  injury and foreign body. No vaginal discharge, erythema, tenderness, bleeding, lesions or prolapsed vaginal walls.     Cervix: No cervical motion tenderness, discharge, friability, lesion, erythema, cervical bleeding or eversion.     Uterus: Not deviated, not enlarged, not fixed, not tender and no uterine prolapse.      Adnexa:        Right: No mass.         Left: No mass.       Rectum: No mass.  Lymphadenopathy:     Upper Body:     Right upper body: No supraclavicular or axillary adenopathy.     Left upper body: No supraclavicular or axillary adenopathy.        Assessment:     54 year old female presents to [facility] for clinical breast exam, pap and mammogram.  Clinical breast exam is unremarkable. Taught self breast awareness.  Specimen collected for pap smear.  Patient has been screened for eligibility.  She does not have any insurance, Medicare or Medicaid.  She also meets financial eligibility.   Risk Assessment    Risk Scores      03/16/2020   Last edited by: Alta Corning, CMA   5-year risk: 1.3 %   Lifetime risk: 8 %        Tyrer-Cuzick breast cancer risk assessment with a lifetime risk of 13.9%.  Per NCCN guidelines no modified imaging or genetic testing is recommended.    Plan:     Screening mammogram ordered.  Specimen for pap sent to the lab.  Will follow up per [facility] protocol.

## 2020-03-21 LAB — IGP, APTIMA HPV: HPV Aptima: NEGATIVE

## 2020-03-22 ENCOUNTER — Encounter: Payer: Self-pay | Admitting: *Deleted

## 2020-03-22 NOTE — Progress Notes (Signed)
Letter mailed to inform patient of her normal mammogram and pap smear.  Next mammo due in 1 year and pap smear due in 5 years. 

## 2020-04-12 ENCOUNTER — Encounter: Payer: Self-pay | Admitting: *Deleted

## 2020-11-18 IMAGING — MG DIGITAL SCREENING BILAT W/ TOMO W/ CAD
6 of 10 series · 6 of 30 positions shown · non-contrast
Comparison: Previous exam(s).

CLINICAL DATA: Screening.

EXAM:
DIGITAL SCREENING BILATERAL MAMMOGRAM WITH TOMO AND CAD

[R CC synth-2D (1 of 2)]
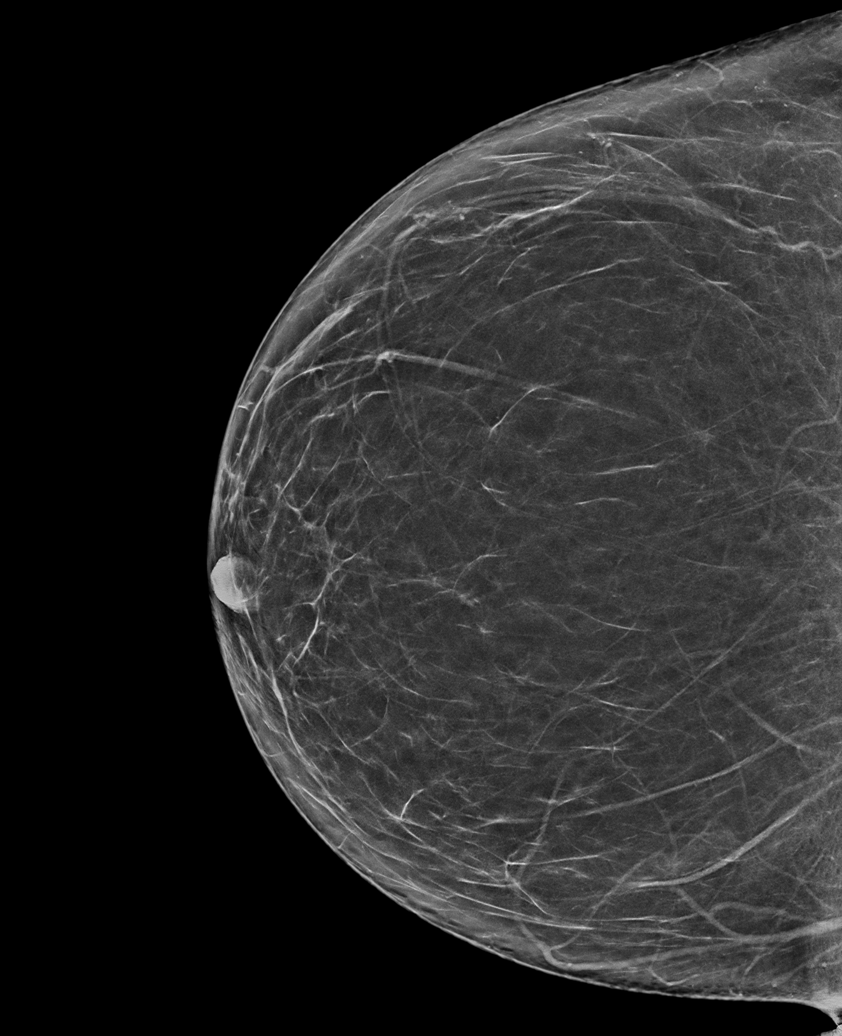

[R MLO synth-2D]
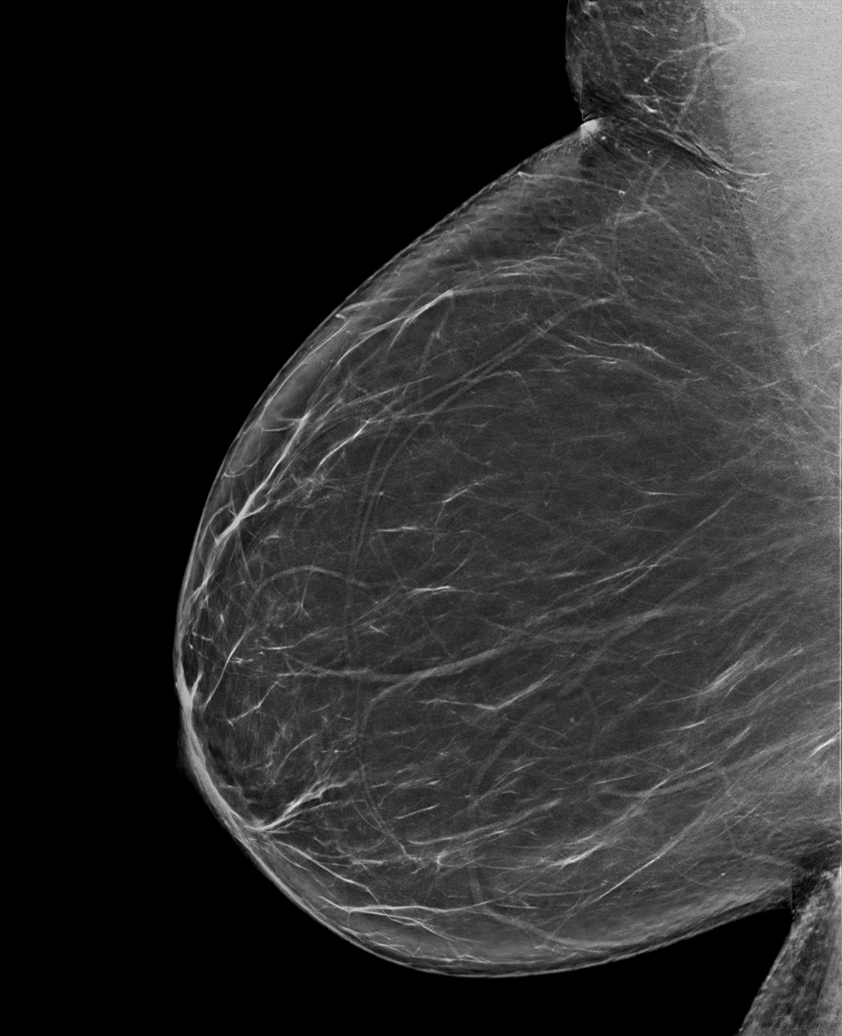

[R CC synth-2D (2 of 2)]
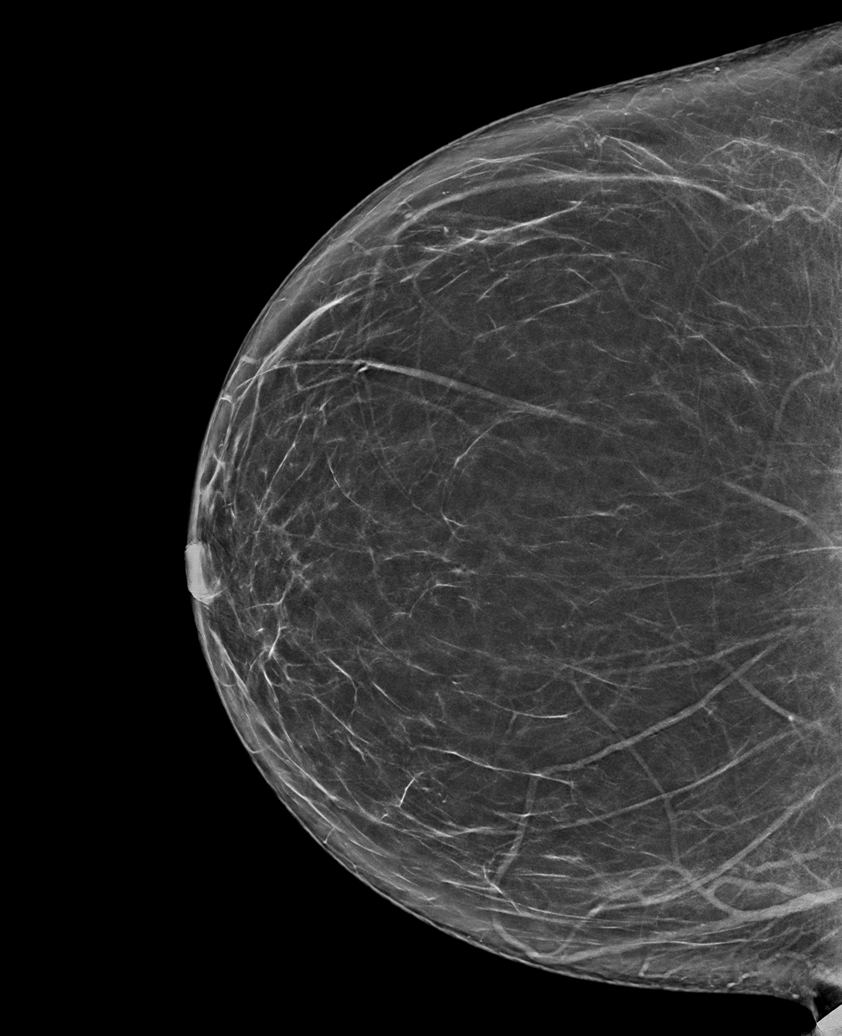

[L CC synth-2D]
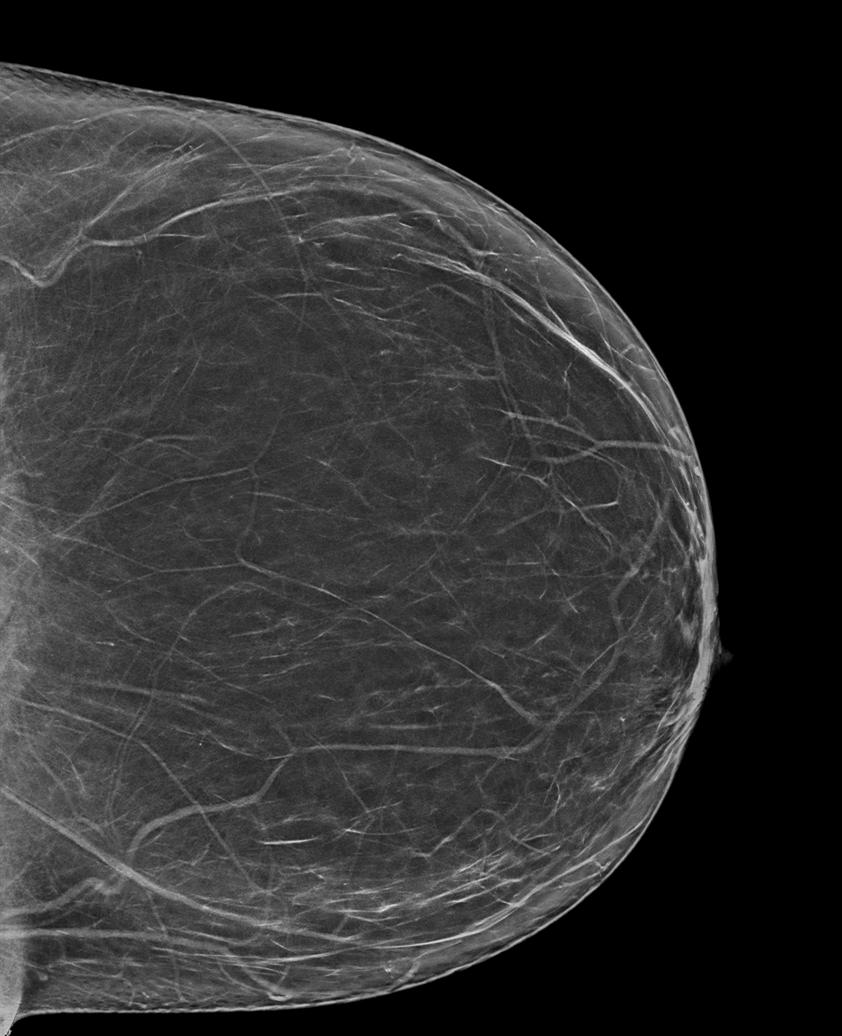

[L MLO synth-2D]
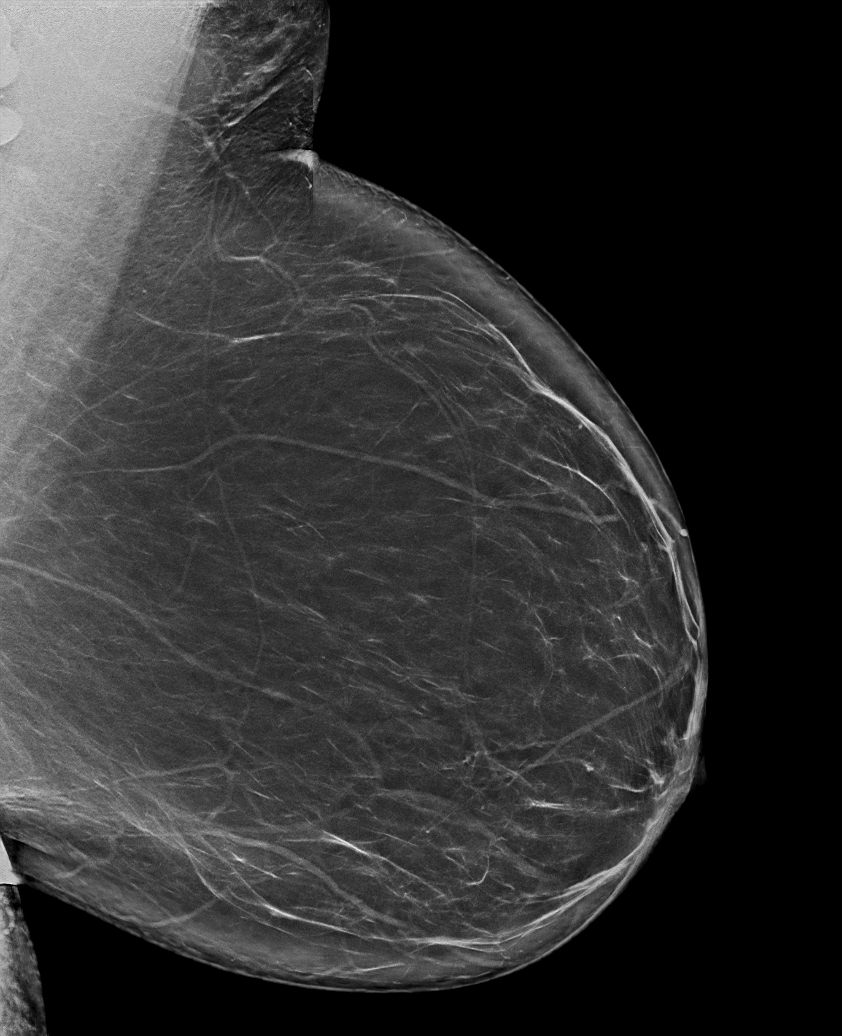

[R CC tomo · tomo slice 39/78.0]
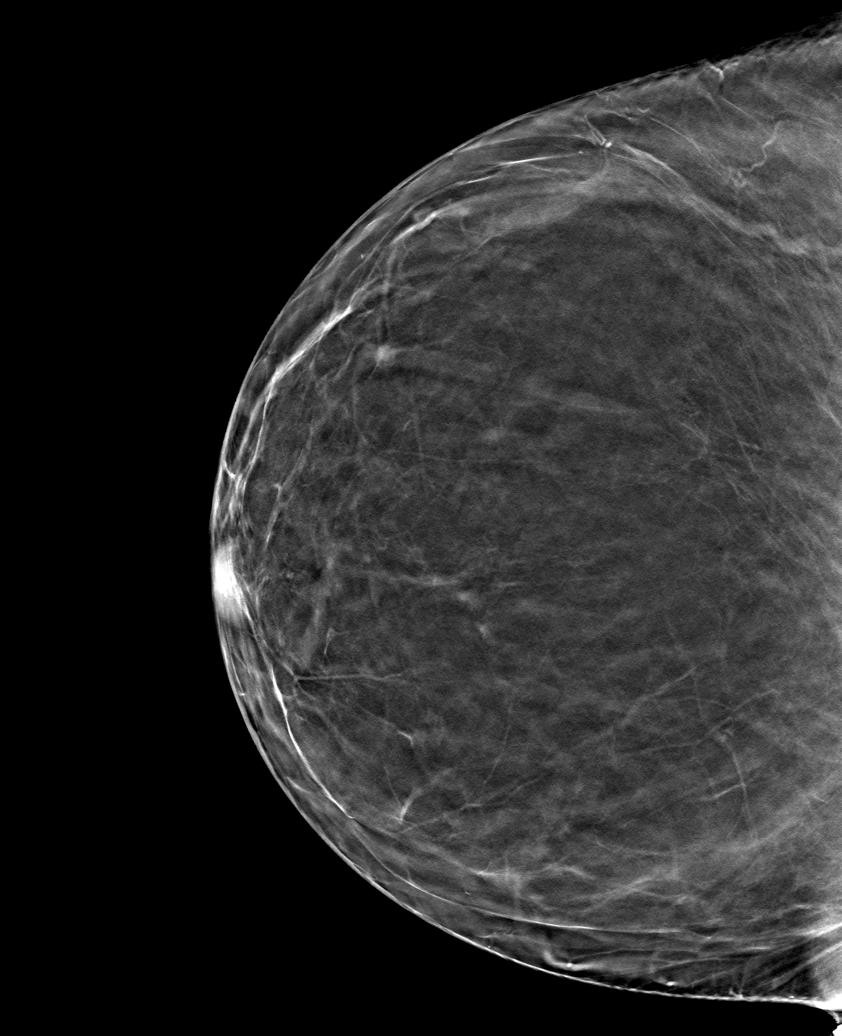

[6 of 30 positions shown; findings below may reference images not displayed]

ACR Breast Density Category b: There are scattered areas of
fibroglandular density.
FINDINGS: There are no findings suspicious for malignancy. Images were
processed with CAD.
IMPRESSION: No mammographic evidence of malignancy. A result letter of this
screening mammogram will be mailed directly to the patient.

RECOMMENDATION:
Screening mammogram in one year. (Code:[id number])

BI-RADS CATEGORY  1: Negative.

## 2021-09-07 DIAGNOSIS — H40053 Ocular hypertension, bilateral: Secondary | ICD-10-CM | POA: Diagnosis not present

## 2021-12-06 ENCOUNTER — Emergency Department: Payer: 59

## 2021-12-06 ENCOUNTER — Emergency Department
Admission: EM | Admit: 2021-12-06 | Discharge: 2021-12-06 | Disposition: A | Discharge status: Home / Self Care | Payer: 59 | Attending: Emergency Medicine | Admitting: Emergency Medicine

## 2021-12-06 ENCOUNTER — Encounter: Payer: Self-pay | Admitting: Radiology

## 2021-12-06 DIAGNOSIS — I10 Essential (primary) hypertension: Secondary | ICD-10-CM | POA: Diagnosis not present

## 2021-12-06 DIAGNOSIS — I110 Hypertensive heart disease with heart failure: Secondary | ICD-10-CM | POA: Diagnosis not present

## 2021-12-06 DIAGNOSIS — E119 Type 2 diabetes mellitus without complications: Secondary | ICD-10-CM | POA: Diagnosis not present

## 2021-12-06 DIAGNOSIS — I509 Heart failure, unspecified: Secondary | ICD-10-CM | POA: Insufficient documentation

## 2021-12-06 DIAGNOSIS — T5991XA Toxic effect of unspecified gases, fumes and vapors, accidental (unintentional), initial encounter: Secondary | ICD-10-CM | POA: Insufficient documentation

## 2021-12-06 DIAGNOSIS — Z743 Need for continuous supervision: Secondary | ICD-10-CM | POA: Diagnosis not present

## 2021-12-06 DIAGNOSIS — J80 Acute respiratory distress syndrome: Secondary | ICD-10-CM | POA: Diagnosis not present

## 2021-12-06 DIAGNOSIS — R0689 Other abnormalities of breathing: Secondary | ICD-10-CM | POA: Diagnosis not present

## 2021-12-06 DIAGNOSIS — J689 Unspecified respiratory condition due to chemicals, gases, fumes and vapors: Secondary | ICD-10-CM | POA: Diagnosis not present

## 2021-12-06 DIAGNOSIS — R059 Cough, unspecified: Secondary | ICD-10-CM | POA: Diagnosis not present

## 2021-12-06 DIAGNOSIS — F17200 Nicotine dependence, unspecified, uncomplicated: Secondary | ICD-10-CM | POA: Diagnosis not present

## 2021-12-06 DIAGNOSIS — H40053 Ocular hypertension, bilateral: Secondary | ICD-10-CM | POA: Diagnosis not present

## 2021-12-06 DIAGNOSIS — R0600 Dyspnea, unspecified: Secondary | ICD-10-CM | POA: Diagnosis not present

## 2021-12-06 DIAGNOSIS — T1490XA Injury, unspecified, initial encounter: Secondary | ICD-10-CM

## 2021-12-06 DIAGNOSIS — D72829 Elevated white blood cell count, unspecified: Secondary | ICD-10-CM | POA: Insufficient documentation

## 2021-12-06 DIAGNOSIS — R0602 Shortness of breath: Secondary | ICD-10-CM | POA: Diagnosis not present

## 2021-12-06 LAB — CBC
HCT: 42.1 % (ref 36.0–46.0)
Hemoglobin: 13.1 g/dL (ref 12.0–15.0)
MCH: 29.3 pg (ref 26.0–34.0)
MCHC: 31.1 g/dL (ref 30.0–36.0)
MCV: 94.2 fL (ref 80.0–100.0)
Platelets: 303 10*3/uL (ref 150–400)
RBC: 4.47 MIL/uL (ref 3.87–5.11)
RDW: 13.5 % (ref 11.5–15.5)
WBC: 12.7 10*3/uL — ABNORMAL HIGH (ref 4.0–10.5)
nRBC: 0 % (ref 0.0–0.2)

## 2021-12-06 LAB — COMPREHENSIVE METABOLIC PANEL
ALT: 25 U/L (ref 0–44)
AST: 15 U/L (ref 15–41)
Albumin: 4.2 g/dL (ref 3.5–5.0)
Alkaline Phosphatase: 94 U/L (ref 38–126)
Anion gap: 12 (ref 5–15)
BUN: 36 mg/dL — ABNORMAL HIGH (ref 6–20)
CO2: 26 mmol/L (ref 22–32)
Calcium: 9.2 mg/dL (ref 8.9–10.3)
Chloride: 105 mmol/L (ref 98–111)
Creatinine, Ser: 2.34 mg/dL — ABNORMAL HIGH (ref 0.44–1.00)
GFR, Estimated: 24 mL/min — ABNORMAL LOW (ref >60)
Glucose, Bld: 119 mg/dL — ABNORMAL HIGH (ref 70–99)
Potassium: 4.6 mmol/L (ref 3.5–5.1)
Sodium: 143 mmol/L (ref 135–145)
Total Bilirubin: 0.7 mg/dL (ref 0.3–1.2)
Total Protein: 8.5 g/dL — ABNORMAL HIGH (ref 6.5–8.1)

## 2021-12-06 MED ORDER — IPRATROPIUM-ALBUTEROL 0.5-2.5 (3) MG/3ML IN SOLN
3.0000 mL | Freq: Once | RESPIRATORY_TRACT | Status: AC
  Administered 2021-12-06: 3 mL via RESPIRATORY_TRACT
  Filled 2021-12-06: qty 3

## 2021-12-06 MED ORDER — METHYLPREDNISOLONE SODIUM SUCC 125 MG IJ SOLR
125.0000 mg | Freq: Once | INTRAMUSCULAR | Status: AC
  Administered 2021-12-06: 125 mg via INTRAVENOUS
  Filled 2021-12-06: qty 2

## 2021-12-06 MED ORDER — ALBUTEROL SULFATE HFA 108 (90 BASE) MCG/ACT IN AERS: 2.0000 | INHALATION_SPRAY | Freq: Four times a day (QID) | RESPIRATORY_TRACT | 2 refills | Status: AC | PRN

## 2021-12-06 MED ORDER — PREDNISONE 20 MG PO TABS: 40.0000 mg | ORAL_TABLET | Freq: Every day | ORAL | 0 refills | Status: AC

## 2021-12-06 NOTE — ED Provider Notes (Signed)
[facility] Provider Note    Event Date/Time   First MD Initiated Contact with Patient 12/06/21 1859     (approximate)  History   Chief Complaint: Toxic Inhalation (Patient was cleaning with Bleach and Tilex mixture that caused her to start having cough and dyspnea; She says she will go outside when this happens and it usually helps but today she was unable to rebound and continued having cough and dyspnea)  HPI  Tracey Nolan is a 56 y.o. female with a past medical history of CHF, diabetes, gastric reflux, hypertension, daily smoker, presents to the emergency department for shortness of breath and cough.  According to the patient she was cleaning with bleach and Tylex when she began having shortness of breath and cough.  Patient states this is happened multiple times in the past often times she is able to go outside to get fresh air and she feels better but today has not been helping.  Patient presents to the emergency department with a nonrebreather mask however currently satting in the upper 90s to 100% on room air.  Patient does have a frequent cough and has an audible expiratory wheeze.  Physical Exam   Triage Vital Signs: ED Triage Vitals [12/06/21 1856]  Enc Vitals Group     BP (!) 159/89     Pulse Rate (!) 113     Resp (!) 32     Temp 98.3 F (36.8 C)     Temp Source Oral     SpO2 99 %     Weight      Height      Head Circumference      Peak Flow      Pain Score      Pain Loc      Pain Edu?      Excl. in GC?     Most recent vital signs: Vitals:   12/06/21 1856  BP: (!) 159/89  Pulse: (!) 113  Resp: (!) 32  Temp: 98.3 F (36.8 C)  SpO2: 99%    General: Awake, no distress.  CV:  Good peripheral perfusion.  Regular rate and rhythm  Resp:  Mild tachypnea around 25 to 30 breaths/min.  Moderate expiratory wheeze bilaterally. Abd:  No distention.  Soft, nontender.  No rebound or guarding.    ED Results / Procedures / Treatments    RADIOLOGY  I reviewed the patient's chest x-ray.  On my interpretation there is no acute abnormality. Chest x-ray read as negative  MEDICATIONS ORDERED IN ED: Medications  ipratropium-albuterol (DUONEB) 0.5-2.5 (3) MG/3ML nebulizer solution 3 mL (has no administration in time range)  ipratropium-albuterol (DUONEB) 0.5-2.5 (3) MG/3ML nebulizer solution 3 mL (has no administration in time range)  methylPREDNISolone sodium succinate (SOLU-MEDROL) 125 mg/2 mL injection 125 mg (has no administration in time range)     IMPRESSION / MDM / ASSESSMENT AND PLAN / ED COURSE  I reviewed the triage vital signs and the nursing notes.  Patient's presentation is most consistent with acute presentation with potential threat to life or bodily function.  \Patient presents to the emergency department for shortness of breath and cough.  Patient states she was cleaning with bleach and Tylex.  Patient states this is happened multiple times in the past usually it resolves after some time with fresh air but not today.  Patient continues to have an expiratory wheeze.  We will dose DuoNebs we will dose Solu-Medrol.  Patient denies any history of asthma or COPD  but is a daily smoker.  We will check labs and continue to closely monitor.  Patient agreeable to plan of care.  Patient's labs are resulting showing a reassuring CBC with slight leukocytosis.  Reassuring chemistry with renal insufficiency unchanged from prior labs seen on the patient's care everywhere chart review.  Patient is feeling much better.  We will discharge with a course of prednisone and have the patient follow-up with her doctor.  We will also discharge with albuterol to be used if needed.  Patient currently satting 98% on room air during my evaluation no respiratory distress no longer has any wheeze on my examination.  FINAL CLINICAL IMPRESSION(S) / ED DIAGNOSES   Dyspnea Inhalation exposure   Note:  This document was prepared using Dragon  voice recognition software and may include unintentional dictation errors.   Minna Antis, MD 12/06/21 2204

## 2021-12-06 NOTE — ED Triage Notes (Signed)
Patient was cleaning with Bleach and Tilex mixture that caused her to start having cough and dyspnea; She says she will go outside when this happens and it usually helps but today she was unable to rebound and continued having cough and dyspnea

## 2022-03-24 DIAGNOSIS — E785 Hyperlipidemia, unspecified: Secondary | ICD-10-CM | POA: Diagnosis not present

## 2022-03-24 DIAGNOSIS — I509 Heart failure, unspecified: Secondary | ICD-10-CM | POA: Diagnosis not present

## 2022-03-24 DIAGNOSIS — Z7984 Long term (current) use of oral hypoglycemic drugs: Secondary | ICD-10-CM | POA: Diagnosis not present

## 2022-03-24 DIAGNOSIS — E039 Hypothyroidism, unspecified: Secondary | ICD-10-CM | POA: Diagnosis not present

## 2022-03-24 DIAGNOSIS — F17200 Nicotine dependence, unspecified, uncomplicated: Secondary | ICD-10-CM | POA: Diagnosis not present

## 2022-03-24 DIAGNOSIS — M109 Gout, unspecified: Secondary | ICD-10-CM | POA: Diagnosis not present

## 2022-03-24 DIAGNOSIS — M1990 Unspecified osteoarthritis, unspecified site: Secondary | ICD-10-CM | POA: Diagnosis not present

## 2022-03-24 DIAGNOSIS — I110 Hypertensive heart disease with heart failure: Secondary | ICD-10-CM | POA: Diagnosis not present

## 2022-03-24 DIAGNOSIS — E119 Type 2 diabetes mellitus without complications: Secondary | ICD-10-CM | POA: Diagnosis not present

## 2022-03-24 DIAGNOSIS — Z6841 Body Mass Index (BMI) 40.0 and over, adult: Secondary | ICD-10-CM | POA: Diagnosis not present

## 2022-03-24 DIAGNOSIS — Z8249 Family history of ischemic heart disease and other diseases of the circulatory system: Secondary | ICD-10-CM | POA: Diagnosis not present

## 2022-04-02 DIAGNOSIS — M1A0710 Idiopathic chronic gout, right ankle and foot, without tophus (tophi): Secondary | ICD-10-CM | POA: Diagnosis not present

## 2022-04-02 DIAGNOSIS — E119 Type 2 diabetes mellitus without complications: Secondary | ICD-10-CM | POA: Diagnosis not present

## 2022-04-02 DIAGNOSIS — I10 Essential (primary) hypertension: Secondary | ICD-10-CM | POA: Diagnosis not present

## 2022-04-02 DIAGNOSIS — Z124 Encounter for screening for malignant neoplasm of cervix: Secondary | ICD-10-CM | POA: Diagnosis not present

## 2022-04-02 DIAGNOSIS — Z113 Encounter for screening for infections with a predominantly sexual mode of transmission: Secondary | ICD-10-CM | POA: Diagnosis not present

## 2022-04-07 DIAGNOSIS — K047 Periapical abscess without sinus: Secondary | ICD-10-CM | POA: Diagnosis not present

## 2022-04-09 ENCOUNTER — Emergency Department
Admission: EM | Admit: 2022-04-09 | Discharge: 2022-04-09 | Disposition: A | Discharge status: Home / Self Care | Payer: 59 | Attending: Emergency Medicine | Admitting: Emergency Medicine

## 2022-04-09 ENCOUNTER — Other Ambulatory Visit: Payer: Self-pay

## 2022-04-09 ENCOUNTER — Encounter: Payer: Self-pay | Admitting: Emergency Medicine

## 2022-04-09 ENCOUNTER — Other Ambulatory Visit: Payer: Self-pay | Admitting: Nurse Practitioner

## 2022-04-09 ENCOUNTER — Emergency Department: Payer: 59

## 2022-04-09 DIAGNOSIS — N189 Chronic kidney disease, unspecified: Secondary | ICD-10-CM | POA: Diagnosis not present

## 2022-04-09 DIAGNOSIS — K0889 Other specified disorders of teeth and supporting structures: Secondary | ICD-10-CM | POA: Diagnosis not present

## 2022-04-09 DIAGNOSIS — I130 Hypertensive heart and chronic kidney disease with heart failure and stage 1 through stage 4 chronic kidney disease, or unspecified chronic kidney disease: Secondary | ICD-10-CM | POA: Insufficient documentation

## 2022-04-09 DIAGNOSIS — R220 Localized swelling, mass and lump, head: Secondary | ICD-10-CM | POA: Diagnosis not present

## 2022-04-09 DIAGNOSIS — Z1231 Encounter for screening mammogram for malignant neoplasm of breast: Secondary | ICD-10-CM

## 2022-04-09 DIAGNOSIS — K047 Periapical abscess without sinus: Secondary | ICD-10-CM | POA: Insufficient documentation

## 2022-04-09 DIAGNOSIS — I509 Heart failure, unspecified: Secondary | ICD-10-CM | POA: Insufficient documentation

## 2022-04-09 LAB — CBC WITH DIFFERENTIAL/PLATELET
Abs Immature Granulocytes: 0.04 10*3/uL (ref 0.00–0.07)
Basophils Absolute: 0.1 10*3/uL (ref 0.0–0.1)
Basophils Relative: 1 %
Eosinophils Absolute: 0.2 10*3/uL (ref 0.0–0.5)
Eosinophils Relative: 2 %
HCT: 43.6 % (ref 36.0–46.0)
Hemoglobin: 13.6 g/dL (ref 12.0–15.0)
Immature Granulocytes: 0 %
Lymphocytes Relative: 35 %
Lymphs Abs: 3.6 10*3/uL (ref 0.7–4.0)
MCH: 29.4 pg (ref 26.0–34.0)
MCHC: 31.2 g/dL (ref 30.0–36.0)
MCV: 94.4 fL (ref 80.0–100.0)
Monocytes Absolute: 0.9 10*3/uL (ref 0.1–1.0)
Monocytes Relative: 8 %
Neutro Abs: 5.7 10*3/uL (ref 1.7–7.7)
Neutrophils Relative %: 54 %
Platelets: 269 10*3/uL (ref 150–400)
RBC: 4.62 MIL/uL (ref 3.87–5.11)
RDW: 13.5 % (ref 11.5–15.5)
WBC: 10.5 10*3/uL (ref 4.0–10.5)
nRBC: 0 % (ref 0.0–0.2)

## 2022-04-09 LAB — COMPREHENSIVE METABOLIC PANEL
ALT: 11 U/L (ref 0–44)
AST: 14 U/L — ABNORMAL LOW (ref 15–41)
Albumin: 3.7 g/dL (ref 3.5–5.0)
Alkaline Phosphatase: 90 U/L (ref 38–126)
Anion gap: 8 (ref 5–15)
BUN: 22 mg/dL — ABNORMAL HIGH (ref 6–20)
CO2: 24 mmol/L (ref 22–32)
Calcium: 9.1 mg/dL (ref 8.9–10.3)
Chloride: 107 mmol/L (ref 98–111)
Creatinine, Ser: 2.05 mg/dL — ABNORMAL HIGH (ref 0.44–1.00)
GFR, Estimated: 28 mL/min — ABNORMAL LOW (ref >60)
Glucose, Bld: 126 mg/dL — ABNORMAL HIGH (ref 70–99)
Potassium: 4.4 mmol/L (ref 3.5–5.1)
Sodium: 139 mmol/L (ref 135–145)
Total Bilirubin: 0.7 mg/dL (ref 0.3–1.2)
Total Protein: 8 g/dL (ref 6.5–8.1)

## 2022-04-09 MED ORDER — AMOXICILLIN-POT CLAVULANATE 875-125 MG PO TABS
1.0000 | ORAL_TABLET | Freq: Once | ORAL | Status: AC
  Administered 2022-04-09: 1 via ORAL
  Filled 2022-04-09: qty 1

## 2022-04-09 MED ORDER — AMOXICILLIN-POT CLAVULANATE 875-125 MG PO TABS: 1.0000 | ORAL_TABLET | Freq: Two times a day (BID) | ORAL | 0 refills | Status: AC

## 2022-04-09 MED ORDER — LIDOCAINE VISCOUS HCL 2 % MT SOLN
15.0000 mL | Freq: Once | OROMUCOSAL | Status: AC
  Administered 2022-04-09: 15 mL via OROMUCOSAL
  Filled 2022-04-09: qty 15

## 2022-04-09 MED ORDER — LIDOCAINE-EPINEPHRINE 2 %-1:100000 IJ SOLN
20.0000 mL | Freq: Once | INTRAMUSCULAR | Status: AC
  Administered 2022-04-09: 20 mL
  Filled 2022-04-09: qty 1

## 2022-04-09 NOTE — ED Provider Triage Note (Signed)
Emergency Medicine Provider Triage Evaluation Note  Tracey Nolan , a 56 y.o. female  was evaluated in triage.  Pt complains of right facial swelling.  Seen at urgent care 2 days ago and placed on penicillin.  Review of Systems  Positive: Facial swelling Negative: Fever, vomiting  Physical Exam  BP (!) 150/101 (BP Location: Left Arm) Comment: Pt states has not had her BP meds today  Pulse (!) 103   Temp 98.1 F (36.7 C) (Oral)   Resp 20   Ht 5\' 4"  (1.626 m)   Wt 131.5 kg   SpO2 97%   BMI 49.78 kg/m  Gen:   Awake, mild distress   Resp:  Normal effort  MSK:   Moves extremities without difficulty  Other:  Right facial swelling.  EOMI  Medical Decision Making  Medically screening exam initiated at 5:36 AM.  Appropriate orders placed.  Tracey Nolan was informed that the remainder of the evaluation will be completed by another provider, this initial triage assessment does not replace that evaluation, and the importance of remaining in the ED until their evaluation is complete.  56 year old female on penicillin x 48 hours presenting with right facial swelling.  Will obtain basic lab work, CT maxillofacial with contrast.   59, MD 04/09/22 [id number]

## 2022-04-09 NOTE — ED Provider Notes (Signed)
[facility] Provider Note    Event Date/Time   First MD Initiated Contact with Patient 04/09/22 [phone]     (approximate)   History   Dental Pain   HPI  Tracey Nolan is a 56 y.o. female CHF CKD diabetes hypertension GERD who presents with dental pain.  Patient broke the right upper molar on Saturday, 2 days ago.  Went to urgent care and was prescribed penicillin.  However pain and swelling have worsened and she is not able to put her partial dentures in.  Denies fevers chills.     Past Medical History:  Diagnosis Date   CHF (congestive heart failure) (HCC)    Diabetes mellitus without complication (HCC)    GERD (gastroesophageal reflux disease)    Hypertension    Thyroid disease     There are no problems to display for this patient.    Physical Exam  Triage Vital Signs: ED Triage Vitals [04/09/22 0531]  Enc Vitals Group     BP (!) 150/101     Pulse Rate (!) 103     Resp 20     Temp 98.1 F (36.7 C)     Temp Source Oral     SpO2 97 %     Weight 290 lb (131.5 kg)     Height 5\' 4"  (1.626 m)     Head Circumference      Peak Flow      Pain Score 9     Pain Loc      Pain Edu?      Excl. in GC?     Most recent vital signs: Vitals:   04/09/22 0531 04/09/22 0944  BP: (!) 150/101 (!) 148/99  Pulse: (!) 103 99  Resp: 20 20  Temp: 98.1 F (36.7 C) 98 F (36.7 C)  SpO2: 97% 97%     General: Awake, no distress.  CV:  Good peripheral perfusion.  Resp:  Normal effort.  Abd:  No distention.  Neuro:             Awake, Alert, Oriented x 3  Other:  Swelling about the right cheek noted, no trismus   There is a broken right upper molar with associated gingival abscess   ED Results / Procedures / Treatments  Labs (all labs ordered are listed, but only abnormal results are displayed) Labs Reviewed  COMPREHENSIVE METABOLIC PANEL - Abnormal; Notable for the following components:      Result Value   Glucose, Bld 126 (*)    BUN 22 (*)     Creatinine, Ser 2.05 (*)    AST 14 (*)    GFR, Estimated 28 (*)    All other components within normal limits  CBC WITH DIFFERENTIAL/PLATELET     EKG     RADIOLOGY Interpreted I reviewed interpreted the CT max face which shows dental abscess   PROCEDURES:  Critical Care performed: No  ..Incision and Drainage  Date/Time: 04/09/2022 10:45 AM  Performed by: 14/08/2021, MD Authorized by: Georga Hacking, MD   Consent:    Consent obtained:  Verbal   Risks discussed:  Incomplete drainage and bleeding Universal protocol:    Patient identity confirmed:  Verbally with patient Location:    Type:  Abscess   Location:  Mouth   Mouth location:  Alveolar process Sedation:    Sedation type:  None Anesthesia:    Anesthesia method:  Local infiltration and topical application   Topical anesthetic:  Lidocaine  gel   Local anesthetic:  Lidocaine 2% WITH epi Procedure type:    Complexity:  Simple Procedure details:    Ultrasound guidance: no     Needle aspiration: no     Incision types:  Stab incision   Incision depth:  Submucosal   Drainage:  Purulent   Drainage amount:  Copious   Packing materials:  None Post-procedure details:    Procedure completion:  Tolerated      MEDICATIONS ORDERED IN ED: Medications  lidocaine (XYLOCAINE) 2 % viscous mouth solution 15 mL (15 mLs Mouth/Throat Given 04/09/22 1011)  lidocaine-EPINEPHrine (XYLOCAINE W/EPI) 2 %-1:100000 (with pres) injection 20 mL (20 mLs Infiltration Given 04/09/22 1019)  amoxicillin-clavulanate (AUGMENTIN) 875-125 MG per tablet 1 tablet (1 tablet Oral Given 04/09/22 1001)     IMPRESSION / MDM / ASSESSMENT AND PLAN / ED COURSE  I reviewed the triage vital signs and the nursing notes.                              Patient's presentation is most consistent with acute complicated illness / injury requiring diagnostic workup.  Differential diagnosis includes, but is not limited to, dental abscess,  cellulitis  Patient is a 56 year old female presents with right tooth pain and facial swelling x 2 days.  Broke the right premolar 3 days ago was seen at urgent care and prescribed penicillin.  Arrives today with worsening pain and swelling.  On exam she has a significant right periosteal/gingival abscess.  Performed incision and drainage with copious purulence drained.  Switched antibiotics from penicillin to Augmentin for additional coverage.  Recommended dental follow-up.  Patient is appropriate for discharge.       FINAL CLINICAL IMPRESSION(S) / ED DIAGNOSES   Final diagnoses:  Dental abscess     Rx / DC Orders   ED Discharge Orders          Ordered    amoxicillin-clavulanate (AUGMENTIN) 875-125 MG tablet  2 times daily        04/09/22 1044             Note:  This document was prepared using Dragon voice recognition software and may include unintentional dictation errors.   Georga Hacking, MD 04/09/22 1046

## 2022-04-09 NOTE — ED Notes (Signed)
See triage note  Presents with possible dental abscess   States she broke a tooth last week   Was seen at [facility] and placed on PCN  states swelling and pain is getting worse  Swelling noted to right side of face

## 2022-04-09 NOTE — ED Triage Notes (Signed)
Pt states seen at [facility] on Saturday for dental abscess. Pt states prescribed penicillin and has been taking medication, pt states continued pain and swelling to her teeth. Pt with noted swelling to R side of her face on assessment.

## 2022-05-10 ENCOUNTER — Ambulatory Visit
Admission: RE | Admit: 2022-05-10 | Discharge: 2022-05-10 | Disposition: A | Discharge status: Home / Self Care | Payer: 59 | Source: Ambulatory Visit | Attending: Nurse Practitioner | Admitting: Nurse Practitioner

## 2022-05-10 DIAGNOSIS — Z1231 Encounter for screening mammogram for malignant neoplasm of breast: Secondary | ICD-10-CM | POA: Diagnosis not present

## 2022-09-14 DIAGNOSIS — M109 Gout, unspecified: Secondary | ICD-10-CM | POA: Diagnosis not present

## 2022-11-01 DIAGNOSIS — I10 Essential (primary) hypertension: Secondary | ICD-10-CM | POA: Diagnosis not present

## 2022-11-01 DIAGNOSIS — N184 Chronic kidney disease, stage 4 (severe): Secondary | ICD-10-CM | POA: Diagnosis not present

## 2022-11-01 DIAGNOSIS — E039 Hypothyroidism, unspecified: Secondary | ICD-10-CM | POA: Diagnosis not present

## 2022-11-01 DIAGNOSIS — E119 Type 2 diabetes mellitus without complications: Secondary | ICD-10-CM | POA: Diagnosis not present

## 2022-11-01 DIAGNOSIS — E785 Hyperlipidemia, unspecified: Secondary | ICD-10-CM | POA: Diagnosis not present

## 2022-11-01 DIAGNOSIS — F17200 Nicotine dependence, unspecified, uncomplicated: Secondary | ICD-10-CM | POA: Diagnosis not present

## 2022-11-01 DIAGNOSIS — M109 Gout, unspecified: Secondary | ICD-10-CM | POA: Diagnosis not present

## 2022-11-01 DIAGNOSIS — Z1389 Encounter for screening for other disorder: Secondary | ICD-10-CM | POA: Diagnosis not present

## 2022-11-01 DIAGNOSIS — I509 Heart failure, unspecified: Secondary | ICD-10-CM | POA: Diagnosis not present

## 2022-12-16 DIAGNOSIS — Z1211 Encounter for screening for malignant neoplasm of colon: Secondary | ICD-10-CM | POA: Diagnosis not present

## 2022-12-16 DIAGNOSIS — Z1212 Encounter for screening for malignant neoplasm of rectum: Secondary | ICD-10-CM | POA: Diagnosis not present

## 2023-07-04 ENCOUNTER — Other Ambulatory Visit: Payer: Self-pay | Admitting: Nurse Practitioner

## 2023-07-04 DIAGNOSIS — Z1231 Encounter for screening mammogram for malignant neoplasm of breast: Secondary | ICD-10-CM

## 2023-07-09 ENCOUNTER — Encounter: Payer: Self-pay | Admitting: Internal Medicine

## 2024-05-08 ENCOUNTER — Emergency Department
Admission: EM | Admit: 2024-05-08 | Discharge: 2024-05-08 | Disposition: A | Discharge status: Home / Self Care | Payer: Self-pay | Attending: Emergency Medicine | Admitting: Emergency Medicine

## 2024-05-08 ENCOUNTER — Other Ambulatory Visit: Payer: Self-pay

## 2024-05-08 ENCOUNTER — Emergency Department: Payer: Self-pay

## 2024-05-08 DIAGNOSIS — E119 Type 2 diabetes mellitus without complications: Secondary | ICD-10-CM | POA: Insufficient documentation

## 2024-05-08 DIAGNOSIS — X501XXA Overexertion from prolonged static or awkward postures, initial encounter: Secondary | ICD-10-CM | POA: Insufficient documentation

## 2024-05-08 DIAGNOSIS — M25562 Pain in left knee: Secondary | ICD-10-CM | POA: Insufficient documentation

## 2024-05-08 DIAGNOSIS — I110 Hypertensive heart disease with heart failure: Secondary | ICD-10-CM | POA: Insufficient documentation

## 2024-05-08 DIAGNOSIS — I509 Heart failure, unspecified: Secondary | ICD-10-CM | POA: Insufficient documentation

## 2024-05-08 MED ORDER — OXYCODONE-ACETAMINOPHEN 5-325 MG PO TABS
1.0000 | ORAL_TABLET | Freq: Once | ORAL | Status: AC
  Administered 2024-05-08: 1 via ORAL
  Filled 2024-05-08: qty 1

## 2024-05-08 MED ORDER — MELOXICAM 15 MG PO TABS: 15.0000 mg | ORAL_TABLET | Freq: Every day | ORAL | 0 refills | Status: AC

## 2024-05-08 MED ORDER — OXYCODONE-ACETAMINOPHEN 5-325 MG PO TABS: 1.0000 | ORAL_TABLET | ORAL | 0 refills | Status: AC | PRN

## 2024-05-08 MED ORDER — CYCLOBENZAPRINE HCL 10 MG PO TABS: 10.0000 mg | ORAL_TABLET | Freq: Three times a day (TID) | ORAL | 0 refills | Status: AC | PRN

## 2024-05-08 NOTE — ED Provider Notes (Signed)
 "  [facility] Provider Note   Event Date/Time   First MD Initiated Contact with Patient 05/08/24 0935     (approximate) History  Knee Pain  HPI Tracey Nolan is a 59 y.o. female with a past medical history of obesity, type 2 diabetes, CHF, thyroid  disease, hypertension who presents complaining of left knee pain after feeling a pop this morning.  Patient states that she has had left knee pain since Thanksgiving of this year and had been using ibuprofen/Tylenol with minimal effect.  Patient states that when she woke up this morning and attempted to turn to 1 side while keeping her foot planted she felt an acute pop.  Patient states that since that time she has had swelling and difficulty bearing weight on this left knee. ROS: Patient currently denies any vision changes, tinnitus, difficulty speaking, facial droop, sore throat, chest pain, shortness of breath, abdominal pain, nausea/vomiting/diarrhea, dysuria, or weakness/numbness/paresthesias in any extremity   Physical Exam  Triage Vital Signs: ED Triage Vitals  Encounter Vitals Group     BP 05/08/24 0921 (!) 168/75     Girls Systolic BP Percentile --      Girls Diastolic BP Percentile --      Boys Systolic BP Percentile --      Boys Diastolic BP Percentile --      Pulse Rate 05/08/24 0921 91     Resp 05/08/24 0921 (!) 22     Temp 05/08/24 0921 97.6 F (36.4 C)     Temp Source 05/08/24 0921 Oral     SpO2 05/08/24 0921 96 %     Weight 05/08/24 0930 283 lb (128.4 kg)     Height 05/08/24 0930 5' 4 (1.626 m)     Head Circumference --      Peak Flow --      Pain Score 05/08/24 0929 10     Pain Loc --      Pain Education --      Exclude from Growth Chart --    Most recent vital signs: Vitals:   05/08/24 0921  BP: (!) 168/75  Pulse: 91  Resp: (!) 22  Temp: 97.6 F (36.4 C)  SpO2: 96%   General: Awake, oriented x4. CV:  Good peripheral perfusion. Resp:  Normal effort. Abd:  No  distention. Other:  Middle-aged morbidly obese African-American female resting comfortably in no acute distress.  Tenderness with active and passive range of motion at the left knee in the anterior lateral portion.  There is no laxity with valgus or varus stress and negative anterior/posterior drawer sign ED Results / Procedures / Treatments  Labs (all labs ordered are listed, but only abnormal results are displayed) Labs Reviewed - No data to display RADIOLOGY ED MD interpretation: X-ray of the left knee shows no evidence of acute abnormalities - All radiology independently interpreted and agree with radiology assessment Official radiology report(s): DG Knee 2 Views Left Result Date: 05/08/2024 CLINICAL DATA:  LEFT knee pain since Thanksgiving. Heard something pop and is unable to bear weight currently. EXAM: LEFT KNEE - 1-2 VIEW COMPARISON:  None available FINDINGS: No fracture or dislocation. Soft tissues are normal. Minimal spurring of the patellofemoral compartment. IMPRESSION: No acute radiographic abnormality of the LEFT knee. Electronically Signed   By: Aliene Lloyd M.D.   On: 05/08/2024 10:24   PROCEDURES: Critical Care performed: No Procedures MEDICATIONS ORDERED IN ED: Medications  oxyCODONE-acetaminophen (PERCOCET/ROXICET) 5-325 MG per tablet 1 tablet (has no administration in time  range)   IMPRESSION / MDM / ASSESSMENT AND PLAN / ED COURSE  I reviewed the triage vital signs and the nursing notes.                             The patient is on the cardiac monitor to evaluate for evidence of arrhythmia and/or significant heart rate changes. Patient's presentation is most consistent with acute presentation with potential threat to life or bodily function. 59 year old female presents for left knee pain has been present over the last 2 months that has acutely worsened today Given history, exam and workup I have low suspicion for fracture, dislocation, significant ligamentous injury,  septic arthritis, gout flare, new autoimmune arthropathy, or gonococcal arthropathy.  Interventions: Left knee x-ray shows no evidence of acute abnormalities  Patient placed in knee immobilizer with crutches with instructions for weightbearing as tolerated.  Patient provided with outpatient orthopedic follow-up if symptoms do not improve.  Will provide short course of prescription pain control Disposition: Discharge home with strict return precautions and instructions for prompt primary care follow up in the next week.   FINAL CLINICAL IMPRESSION(S) / ED DIAGNOSES   Final diagnoses:  Acute pain of left knee   Rx / DC Orders   ED Discharge Orders          Ordered    oxyCODONE-acetaminophen (PERCOCET) 5-325 MG tablet  Every 4 hours PRN        05/08/24 1037    cyclobenzaprine  (FLEXERIL ) 10 MG tablet  3 times daily PRN        05/08/24 1037    meloxicam  (MOBIC ) 15 MG tablet  Daily        05/08/24 1037           Note:  This document was prepared using Dragon voice recognition software and may include unintentional dictation errors.   Jamacia Jester K, MD 05/08/24 1042  "

## 2024-05-08 NOTE — ED Triage Notes (Signed)
 C/o L knee pain that started in since Thanksgiving. Pt reports today she heard something pop. Pt reports she is unable to bear weight on knee currently. GCS 15

## 2024-06-11 ENCOUNTER — Emergency Department

## 2024-06-11 ENCOUNTER — Emergency Department
Admission: EM | Admit: 2024-06-11 | Discharge: 2024-06-11 | Disposition: A | Discharge status: Home / Self Care | Source: Home / Self Care | Attending: Emergency Medicine | Admitting: Emergency Medicine

## 2024-06-11 ENCOUNTER — Other Ambulatory Visit: Payer: Self-pay

## 2024-06-11 DIAGNOSIS — T148XXA Other injury of unspecified body region, initial encounter: Secondary | ICD-10-CM

## 2024-06-11 MED ORDER — MELOXICAM 15 MG PO TABS: 15.0000 mg | ORAL_TABLET | Freq: Every day | ORAL | 0 refills | Status: AC

## 2024-06-11 MED ORDER — BACLOFEN 10 MG PO TABS: 10.0000 mg | ORAL_TABLET | Freq: Three times a day (TID) | ORAL | 0 refills | Status: AC

## 2024-06-11 NOTE — ED Notes (Signed)
 Patient declined discharge vital signs.
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
## 2024-06-11 NOTE — Discharge Instructions (Signed)
 Follow-up with your regular doctor as needed.  Use medication as prescribed.  Do not use the muscle relaxer when driving.  Apply ice to any areas that hurt.  Return if worsening.
